# Patient Record
Sex: Male | Born: 1970 | Race: White | Hispanic: No | Marital: Married | Smoking: Never smoker
Health system: Southern US, Community
[De-identification: ages and names within clinical notes are randomized; demographics above are authoritative.]

## PROBLEM LIST (undated history)

## (undated) DIAGNOSIS — F419 Anxiety disorder, unspecified: Secondary | ICD-10-CM

## (undated) DIAGNOSIS — F329 Major depressive disorder, single episode, unspecified: Secondary | ICD-10-CM

## (undated) DIAGNOSIS — F32A Depression, unspecified: Secondary | ICD-10-CM

## (undated) DIAGNOSIS — M2420 Disorder of ligament, unspecified site: Secondary | ICD-10-CM

## (undated) DIAGNOSIS — E0590 Thyrotoxicosis, unspecified without thyrotoxic crisis or storm: Secondary | ICD-10-CM

## (undated) DIAGNOSIS — M722 Plantar fascial fibromatosis: Secondary | ICD-10-CM

## (undated) DIAGNOSIS — Z8619 Personal history of other infectious and parasitic diseases: Secondary | ICD-10-CM

## (undated) HISTORY — DX: Plantar fascial fibromatosis: M72.2

## (undated) HISTORY — DX: Depression, unspecified: F32.A

## (undated) HISTORY — DX: Major depressive disorder, single episode, unspecified: F32.9

## (undated) HISTORY — PX: NO PAST SURGERIES: SHX2092

## (undated) HISTORY — DX: Disorder of ligament, unspecified site: M24.20

## (undated) HISTORY — DX: Anxiety disorder, unspecified: F41.9

## (undated) HISTORY — DX: Thyrotoxicosis, unspecified without thyrotoxic crisis or storm: E05.90

## (undated) HISTORY — DX: Personal history of other infectious and parasitic diseases: Z86.19

---

## 2001-05-17 ENCOUNTER — Emergency Department (HOSPITAL_COMMUNITY): Admission: EM | Admit: 2001-05-17 | Discharge: 2001-05-17 | Payer: Self-pay | Admitting: Emergency Medicine

## 2003-08-07 ENCOUNTER — Emergency Department (HOSPITAL_COMMUNITY): Admission: EM | Admit: 2003-08-07 | Discharge: 2003-08-08 | Payer: Self-pay | Admitting: Emergency Medicine

## 2012-09-17 ENCOUNTER — Ambulatory Visit
Admission: RE | Admit: 2012-09-17 | Discharge: 2012-09-17 | Disposition: A | Discharge status: Home / Self Care | Payer: BC Managed Care – PPO | Source: Ambulatory Visit | Attending: Sports Medicine | Admitting: Sports Medicine

## 2012-09-17 ENCOUNTER — Ambulatory Visit (INDEPENDENT_AMBULATORY_CARE_PROVIDER_SITE_OTHER): Payer: BC Managed Care – PPO | Admitting: Family Medicine

## 2012-09-17 VITALS — BP 120/78 | Ht 72.0 in | Wt 215.0 lb

## 2012-09-17 DIAGNOSIS — R0789 Other chest pain: Secondary | ICD-10-CM

## 2012-09-17 DIAGNOSIS — M949 Disorder of cartilage, unspecified: Secondary | ICD-10-CM

## 2012-09-17 MED ORDER — TRAMADOL HCL 50 MG PO TABS: 50.0000 mg | ORAL_TABLET | Freq: Every evening | ORAL | Status: DC | PRN

## 2012-09-17 MED ORDER — MELOXICAM 15 MG PO TABS: 15.0000 mg | ORAL_TABLET | Freq: Every day | ORAL | Status: DC

## 2012-09-17 NOTE — Patient Instructions (Addendum)
Your xiphoid (the bone at the end of your sternum) is longer and closer to the skin than normal.   Some of your pain may be caused by inflammation of where the ribs attach to the sternum. Take Meloxicam 15mg  daily. Tramadol is also available to take at night for pain. We are going to get some x-rays of your ribs and sternum.  Someone will call you with the results. Depending on the x-rays, we may need to do further imaging. Follow up in 2 weeks.

## 2012-09-17 NOTE — Progress Notes (Signed)
  Subjective:    Patient ID: Bobby Stewart, male    DOB: Dec 19, 1970, 41 y.o.   MRN: [id number]  HPI Bobby Stewart is a 41 year old man coming in today for sternal pain.  He says he first noticed the pain in 2008 after a chiropractic manipulation for a pinched nerve in his neck. Since then, it has recurred intermittently.  The pain is typically located on the left side of the lower sternum and sometimes associated with a heavy feeling and shortness of breath.  Most recently, he had a severe flair 5 days ago following a cough.  Reported severe pain that took his breath for 20 minutes and radiated straight through to his back.  Denies pain being triggered by exercise.  Pain can be triggered by sleeping on his stomach.  No history of heartburn or trouble with spicy foods.  He has had a cousin in her 30s undergo some sort of heart surgery as well as an uncle who had a heart attack at age 57.  Has seen several physicians for this, but has never had x-rays or other work up.  No significant past medical history. Family history significant for heart disease. Never smoker.  Owns sport's bar in town.   Review of Systems     Objective:   Physical Exam Gen: alert, cooperative, NAD CV: RRR, no murmurs Pulm: CTAB, no wheezes or rales Chest: no erythema or edema noted over xiphoid.  On palpation the xiphoid is quite superficial and found at the L1 spinal level.  Tender along the left side of the xiphoid with a palpable squeak.      Assessment & Plan:  Xiphoid pain: Costochondritis vs xiphoid irritation.  Xiphoid is enlarged based on palpation.  May be congenital. History and exam seems most consistent with inflammation.  Will treat with meloxicam 15mg  daily.  Will also provide Tramadol 50mg  tabs for use at bedtime if needed.  Will check plain films of the sternum and left ribs.  May need MRI to evaluate the sternum/xiphoid.  Follow up in 2 weeks. Patient isn't enough pain that he would consider surgical  intervention. We will call [facility] is as well to ask if this would be something that they would potentially see the patient for if this is necessary in the long run. We'll readdress the patient again in 2 weeks.

## 2012-09-24 ENCOUNTER — Ambulatory Visit: Payer: BC Managed Care – PPO | Admitting: Family Medicine

## 2012-10-16 ENCOUNTER — Encounter: Payer: Self-pay | Admitting: Cardiothoracic Surgery

## 2012-10-16 ENCOUNTER — Encounter (INDEPENDENT_AMBULATORY_CARE_PROVIDER_SITE_OTHER): Payer: BC Managed Care – PPO | Admitting: Cardiothoracic Surgery

## 2012-11-06 ENCOUNTER — Institutional Professional Consult (permissible substitution) (INDEPENDENT_AMBULATORY_CARE_PROVIDER_SITE_OTHER): Payer: BC Managed Care – PPO | Admitting: Cardiothoracic Surgery

## 2012-11-06 ENCOUNTER — Encounter: Payer: Self-pay | Admitting: Cardiothoracic Surgery

## 2012-11-06 VITALS — BP 124/81 | HR 52 | Resp 18 | Ht 72.0 in | Wt 218.0 lb

## 2012-11-06 DIAGNOSIS — R0789 Other chest pain: Secondary | ICD-10-CM

## 2012-11-06 DIAGNOSIS — M899 Disorder of bone, unspecified: Secondary | ICD-10-CM

## 2012-11-06 DIAGNOSIS — M949 Disorder of cartilage, unspecified: Secondary | ICD-10-CM

## 2012-11-06 NOTE — Progress Notes (Signed)
PCP is Antoine Primas, DO Referring Provider is Judi Saa, DO  Chief Complaint  Patient presents with  . Chest Pain    eval for sternal/xiphoid pain    HPI: 41 year old Caucasian male nonsmoker referred for evaluation of chronically painful xiphoid. He relates the onset following a session with his chiropractic in which his arms were stretched behind his back on the exam table arching his chest forward and he felt a popping sensation. Since then when he coughs or does certain exercises oral lifts weights and a certain way he has significant sharp pain. This is been intermittent over a few years. He is been evaluated with sternal and rib x-rays which have been nonrevealing. He is been treated with a trial of Mobic as well as tramadol which have been ineffective. He denies any significant false motor vehicle accidents or direct blows to the chest. He denies any history of pneumothorax rib abnormalities or other skeletal disease.   Past Medical History  Diagnosis Date  . Anxiety and depression     No past surgical history on file.  No family history on file.  Social History History  Substance Use Topics  . Smoking status: Never Smoker   . Smokeless tobacco: Never Used  . Alcohol Use: Yes    Current Outpatient Prescriptions  Medication Sig Dispense Refill  . meloxicam (MOBIC) 15 MG tablet Take 1 tablet (15 mg total) by mouth daily.  30 tablet  0  . traMADol (ULTRAM) 50 MG tablet Take 1 tablet (50 mg total) by mouth at bedtime as needed for pain.  50 tablet  0    No Known Allergies  Review of Systems Gen. no weight loss or fever HEENT no difficulty swallowing no change in vision Thorax xiphoid pain Cardiac no history of angina arrhythmia murmur GI no history of hepatitis jaundice blood per rectum Neurologic no history of kidney stones UTI Endocrine no history of diabetes Vascular no history of thromboembolic disease no history of varicose veins Neurologic no history of  seizure syncope, right-hand dominant   BP 124/81  Pulse 52  Resp 18  Ht 6' (1.829 m)  Wt 218 lb (98.884 kg)  BMI 29.57 kg/m2  SpO2 98% Physical Exam Gen. 41 year old healthy muscular male no acute distress HEENT normocephalic pupils equal thorax clear dentition good Neck no JVD mass or carotid bruit Lymphatics not palpable nodes in the neck axilla Thorax clear breath sounds     Tenderness over the xiphoid, left side more than right, which appears to be somewhat elongated but not mobile. 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 The CT scan unremarkable with recommend excision of xiphoid to relieve source of chronic pain. Procedure discussed with patient in the agrees to proceed with this plan. Patient will return after the holidays, get a chest CT scan and then discuss a plan of therapy.

## 2012-12-05 ENCOUNTER — Other Ambulatory Visit: Payer: Self-pay | Admitting: *Deleted

## 2012-12-05 DIAGNOSIS — R0789 Other chest pain: Secondary | ICD-10-CM

## 2012-12-25 ENCOUNTER — Ambulatory Visit: Payer: BC Managed Care – PPO | Admitting: Cardiothoracic Surgery

## 2012-12-25 ENCOUNTER — Other Ambulatory Visit: Payer: BC Managed Care – PPO

## 2013-03-07 ENCOUNTER — Ambulatory Visit
Admission: RE | Admit: 2013-03-07 | Discharge: 2013-03-07 | Disposition: A | Discharge status: Home / Self Care | Payer: BC Managed Care – PPO | Source: Ambulatory Visit | Attending: Medical | Admitting: Medical

## 2013-03-07 ENCOUNTER — Encounter: Payer: Self-pay | Admitting: Medical

## 2013-03-07 ENCOUNTER — Telehealth: Payer: Self-pay | Admitting: Family Medicine

## 2013-03-07 ENCOUNTER — Ambulatory Visit (INDEPENDENT_AMBULATORY_CARE_PROVIDER_SITE_OTHER): Payer: BC Managed Care – PPO | Admitting: Medical

## 2013-03-07 VITALS — BP 112/80 | HR 58 | Temp 98.3°F | Resp 16 | Wt 222.0 lb

## 2013-03-07 DIAGNOSIS — B351 Tinea unguium: Secondary | ICD-10-CM

## 2013-03-07 DIAGNOSIS — S6992XA Unspecified injury of left wrist, hand and finger(s), initial encounter: Secondary | ICD-10-CM

## 2013-03-07 DIAGNOSIS — L259 Unspecified contact dermatitis, unspecified cause: Secondary | ICD-10-CM

## 2013-03-07 DIAGNOSIS — Z23 Encounter for immunization: Secondary | ICD-10-CM

## 2013-03-07 DIAGNOSIS — S6980XA Other specified injuries of unspecified wrist, hand and finger(s), initial encounter: Secondary | ICD-10-CM

## 2013-03-07 DIAGNOSIS — L309 Dermatitis, unspecified: Secondary | ICD-10-CM

## 2013-03-07 DIAGNOSIS — S6990XA Unspecified injury of unspecified wrist, hand and finger(s), initial encounter: Secondary | ICD-10-CM

## 2013-03-07 MED ORDER — TERBINAFINE HCL 250 MG PO TABS: 250.0000 mg | ORAL_TABLET | Freq: Every day | ORAL | Status: DC

## 2013-03-07 NOTE — Addendum Note (Signed)
Addended by: Janeice Robinson on: 03/07/2013 09:42 AM   Modules accepted: Orders

## 2013-03-07 NOTE — Progress Notes (Signed)
Subjective:  Bobby Stewart is a 42 y.o. male who presents as a new patient with hand injury.  Had been to [facility] prior.  He works in a [employer].  He got the left hand caught under a Keg on 02/24/13.   When he went to set the Keg down, it fell on his hand.    Swelling was worse, but he does reports swelling in lateral hand along 4-5th fingers, but has pain mostly pain in the left 5th finger/hand area.  ROM of 4th and 5th finger is decreased.   Has used ice some, Aleve.    Of note, he has longstanding nail deformity of the left hand that he said started when he sat on the nail hard in the past.  Since then the nails have grown funny.  The left fingernails and toenails have yellow coloration.   He has ongoing chronic rough skin of the left hand that he attributes to the nail fungus.   Has used OTC cream in the past with no improvement.  He refutes prior diagnosis by [facility] that the nail issue and hand rash/rough skin was not related to the injury/disease condition he describes.      No other c/o.  The following portions of the patient's history were reviewed and updated as appropriate: allergies, current medications, past family history, past medical history, past social history, past surgical history and problem list.  ROS Otherwise as in subjective above  Objective: Physical Exam  Vital signs reviewed  General appearance: alert, no distress, WD/WN Skin: left palm with generalized mild erythema, flaking, dry and somewhat raw appearing.  Left fingernails with some yellowish coloration mild, left index fingernail almost has a spooning appearance, left fingernails in general thickened, left middle fingernail with growing deformity from prior trauma to nail bed presumably.  Left toenails yellow and thickened throughout. MSK: left hand with tenderness and moderate swelling at distal 4th and 5th metacarpals.  4-5th finger ROM at MCP moderately decreased due to pain at the  metacarpal.  DIPs normal ROM.  otherwise fingers, thumb, wrist with normal ROM, nontender.  Injury suggests boxers fracture although he reports a crush injury.  UE pulses and cap refill normal Neuro: light touch seems decreased in left dorsal distal hand along 4-5th metacarpals in area of injury, but sharp touch normal.  otherwise sensation normal.  Strength of left 4-5th fingers decreased due to pain and swelling in the area of injury.  Rest of hands and fingers with normal strength.   Assessment: Encounter Diagnoses  Name Primary?  . Hand injury, left, initial encounter Yes  . Need for Tdap vaccination   . Onychomycosis   . Dermatitis   . Fingernail injury, left, initial encounter     Plan: Hand injury - will send for xray of left hand.  Likely nondisplaced fracture of distal 4th and 5th metacarpals.  Tdap vaccine, VIS and counseling given  Onychomycosis - begin Lamisil oral x 4-6 wk.  Discussed diagnosis, usual course of treatment and the delayed response to expect as the healthy nail will take time to grow out.  Dermatitis - hand/palm appears suggestive of contact dermatitis.  Fingernail injury - possibly prior trauma as he states, but could be underlying process such as fugal infection, iron deficiency.  We will proceed with antifungal therapy for now.  Follow up pending xray.

## 2013-03-07 NOTE — Telephone Encounter (Signed)
Patient is aware of his appointment at [facility]. On 03/07/13 @ 330 pm to see Dr. Ophelia Charter. CLS [phone]

## 2013-06-12 ENCOUNTER — Other Ambulatory Visit: Payer: Self-pay | Admitting: Medical

## 2013-06-12 NOTE — Telephone Encounter (Signed)
Is this okay to refill? 

## 2013-07-11 ENCOUNTER — Telehealth: Payer: Self-pay | Admitting: *Deleted

## 2013-07-11 ENCOUNTER — Other Ambulatory Visit: Payer: Self-pay | Admitting: Medical

## 2013-07-11 NOTE — Telephone Encounter (Signed)
Got a refill request for a second round of Lamisil, first round was 03/07/13 #42 for 4-6 weeks, called patient and he did state that his nails cleared up after the first round but he feels like a second round would really take care of it. Wasn't sure if he needed to come in to see you, or come in for liver panel? Please advise. Thanks.

## 2013-07-11 NOTE — Telephone Encounter (Signed)
Called patient to let him know that he is required to come in for OV if he is desiring that Lamisil be refilled. Left message.

## 2015-08-25 ENCOUNTER — Other Ambulatory Visit: Payer: Self-pay | Admitting: Chiropractic Medicine

## 2015-08-25 DIAGNOSIS — M542 Cervicalgia: Secondary | ICD-10-CM

## 2015-08-25 DIAGNOSIS — R519 Headache, unspecified: Secondary | ICD-10-CM

## 2015-08-25 DIAGNOSIS — R51 Headache: Secondary | ICD-10-CM

## 2015-09-03 ENCOUNTER — Ambulatory Visit
Admission: RE | Admit: 2015-09-03 | Discharge: 2015-09-03 | Disposition: A | Discharge status: Home / Self Care | Payer: BLUE CROSS/BLUE SHIELD | Source: Ambulatory Visit | Attending: Chiropractic Medicine | Admitting: Chiropractic Medicine

## 2015-09-03 DIAGNOSIS — M542 Cervicalgia: Secondary | ICD-10-CM

## 2015-09-03 DIAGNOSIS — R519 Headache, unspecified: Secondary | ICD-10-CM

## 2015-09-03 DIAGNOSIS — R51 Headache: Secondary | ICD-10-CM

## 2016-07-04 NOTE — Progress Notes (Signed)
This encounter was created in error - please disregard.

## 2017-05-14 DIAGNOSIS — M542 Cervicalgia: Secondary | ICD-10-CM | POA: Diagnosis not present

## 2017-05-14 DIAGNOSIS — M25571 Pain in right ankle and joints of right foot: Secondary | ICD-10-CM | POA: Diagnosis not present

## 2017-05-15 DIAGNOSIS — M542 Cervicalgia: Secondary | ICD-10-CM | POA: Diagnosis not present

## 2017-05-15 DIAGNOSIS — M722 Plantar fascial fibromatosis: Secondary | ICD-10-CM | POA: Diagnosis not present

## 2017-05-22 DIAGNOSIS — M722 Plantar fascial fibromatosis: Secondary | ICD-10-CM | POA: Diagnosis not present

## 2017-05-22 DIAGNOSIS — M542 Cervicalgia: Secondary | ICD-10-CM | POA: Diagnosis not present

## 2017-05-24 DIAGNOSIS — M542 Cervicalgia: Secondary | ICD-10-CM | POA: Diagnosis not present

## 2017-05-24 DIAGNOSIS — M722 Plantar fascial fibromatosis: Secondary | ICD-10-CM | POA: Diagnosis not present

## 2017-05-28 DIAGNOSIS — K644 Residual hemorrhoidal skin tags: Secondary | ICD-10-CM | POA: Diagnosis not present

## 2017-05-28 DIAGNOSIS — M722 Plantar fascial fibromatosis: Secondary | ICD-10-CM | POA: Diagnosis not present

## 2017-05-28 DIAGNOSIS — M542 Cervicalgia: Secondary | ICD-10-CM | POA: Diagnosis not present

## 2017-05-31 DIAGNOSIS — K645 Perianal venous thrombosis: Secondary | ICD-10-CM | POA: Diagnosis not present

## 2017-06-05 DIAGNOSIS — M722 Plantar fascial fibromatosis: Secondary | ICD-10-CM | POA: Diagnosis not present

## 2017-06-05 DIAGNOSIS — M542 Cervicalgia: Secondary | ICD-10-CM | POA: Diagnosis not present

## 2017-06-11 DIAGNOSIS — M542 Cervicalgia: Secondary | ICD-10-CM | POA: Diagnosis not present

## 2017-06-11 DIAGNOSIS — M722 Plantar fascial fibromatosis: Secondary | ICD-10-CM | POA: Diagnosis not present

## 2017-08-24 ENCOUNTER — Ambulatory Visit (INDEPENDENT_AMBULATORY_CARE_PROVIDER_SITE_OTHER): Payer: 59

## 2017-08-24 ENCOUNTER — Encounter: Payer: Self-pay | Admitting: Podiatry

## 2017-08-24 ENCOUNTER — Ambulatory Visit (INDEPENDENT_AMBULATORY_CARE_PROVIDER_SITE_OTHER): Payer: 59 | Admitting: Podiatry

## 2017-08-24 VITALS — BP 112/53 | HR 53

## 2017-08-24 DIAGNOSIS — M79671 Pain in right foot: Secondary | ICD-10-CM

## 2017-08-24 DIAGNOSIS — M779 Enthesopathy, unspecified: Secondary | ICD-10-CM | POA: Diagnosis not present

## 2017-08-24 DIAGNOSIS — M79672 Pain in left foot: Secondary | ICD-10-CM

## 2017-08-24 MED ORDER — TRIAMCINOLONE ACETONIDE 10 MG/ML IJ SUSP
10.0000 mg | Freq: Once | INTRAMUSCULAR | Status: AC
  Administered 2017-08-24: 10 mg

## 2017-08-24 MED ORDER — DICLOFENAC SODIUM 75 MG PO TBEC: 75.0000 mg | DELAYED_RELEASE_TABLET | Freq: Two times a day (BID) | ORAL | 2 refills | Status: DC

## 2017-08-24 MED ORDER — TERBINAFINE HCL 250 MG PO TABS: 250.0000 mg | ORAL_TABLET | Freq: Every day | ORAL | 0 refills | Status: DC

## 2017-08-24 NOTE — Progress Notes (Signed)
   Subjective:    Patient ID: Bobby Stewart, male    DOB: 09/19/1971, 46 y.o.   MRN: [id number]  HPI  Chief Complaint  Patient presents with  . Foot Pain    B/L       Review of Systems  Musculoskeletal: Positive for gait problem.       Wrist pain  Neurological: Positive for numbness and headaches.  All other systems reviewed and are negative.      Objective:   Physical Exam        Assessment & Plan:

## 2017-08-25 NOTE — Progress Notes (Signed)
Subjective:    Patient ID: Bobby Stewart, male   DOB: 46 y.o.   MRN: [id number]   HPI patient presents with quite a bit of discomfort around the medial ankle region bilateral with history of this problem that has occurred periodically over the last few years with history of boot usage. The right is worse    Review of Systems  All other systems reviewed and are negative.       Objective:  Physical Exam  Constitutional: He appears well-developed and well-nourished.  Cardiovascular: Intact distal pulses.   Pulmonary/Chest: Effort normal.  Musculoskeletal: Normal range of motion.  Neurological: He is alert. Abnormal coordination: neurovascular status intact muscle strength adequate range of motion was found to be within normal limits with moderate depression of the arch. Patient has significant discomfort in the posterior tibial tendon right at the insertion and slightly proximal t.  Skin: Skin is warm.  Nursing note and vitals reviewed.  2 the tendon with no indication of posterior tibial dysfunction with mild edema within the area. Also has mild nail disease and skin disease between the toes     Assessment:    Posterior tibial tendinitis right. Noted to have probable mycotic infection of skin with mild nail involvement   Plan:    Careful sheath injection administered 3 mg Kenalog 5 mg Xylocaine and applied fascial brace to lift the arch. Reviewed x-rays and discussed orthotics and other treatments depending on response  X-rays indicate that there is moderate depression of the arch with no indications of advanced pathology. Also discussed nail disease and I did go ahead today I placed him on Lamisil 250 for 30 days and placed him on oral anti-inflammatory diclofenac

## 2017-08-27 ENCOUNTER — Telehealth: Payer: Self-pay | Admitting: Medical

## 2017-08-27 NOTE — Telephone Encounter (Signed)
Pt states he is longer a pt here,

## 2017-08-27 NOTE — Telephone Encounter (Signed)
Please call patient to set up yearly physical.   This is a friendly reminder to have them come in for annual wellness exam/physical. Thanks Shane 

## 2017-09-07 ENCOUNTER — Ambulatory Visit (INDEPENDENT_AMBULATORY_CARE_PROVIDER_SITE_OTHER): Payer: 59 | Admitting: Podiatry

## 2017-09-07 ENCOUNTER — Encounter: Payer: Self-pay | Admitting: Podiatry

## 2017-09-07 DIAGNOSIS — M779 Enthesopathy, unspecified: Secondary | ICD-10-CM

## 2017-09-07 NOTE — Progress Notes (Signed)
Subjective:    Patient ID: Bobby Stewart, male   DOB: 46 y.o.   MRN: [id number]   HPI patient states she's improved but still having soreness    ROS      Objective:  Physical Exam neurovascular status intact with continued tendinitis posterior tibial tendon right that's moderately improved but still mildly discomforting along with the left     Assessment:    Chronic tendinitis bilateral     Plan:    H&P condition reviewed and I've recommended long-term orthotics and I scheduled him with [facility] for orthotic devices. He is on his feet quite a bit at work and will benefit from a device that provide support and cushion and also he is quite active with exercise

## 2017-09-13 ENCOUNTER — Other Ambulatory Visit: Payer: 59 | Admitting: Orthotics

## 2017-09-13 DIAGNOSIS — M722 Plantar fascial fibromatosis: Secondary | ICD-10-CM

## 2017-10-04 ENCOUNTER — Ambulatory Visit: Payer: 59 | Admitting: Orthotics

## 2017-10-04 DIAGNOSIS — M79672 Pain in left foot: Secondary | ICD-10-CM

## 2017-10-04 DIAGNOSIS — M779 Enthesopathy, unspecified: Secondary | ICD-10-CM

## 2017-10-04 DIAGNOSIS — M79671 Pain in right foot: Secondary | ICD-10-CM

## 2017-10-04 NOTE — Progress Notes (Signed)
Patient came in today to pick up custom made foot orthotics.  The goals were accomplished and the patient reported no dissatisfaction with said orthotics.  Patient was advised of breakin period and how to report any issues. 

## 2017-10-05 ENCOUNTER — Telehealth: Payer: Self-pay | Admitting: *Deleted

## 2017-10-05 NOTE — Telephone Encounter (Signed)
Refill request terbinafine. Dr.Regal prescribed 08/24/2017 #30.

## 2017-10-19 ENCOUNTER — Telehealth: Payer: Self-pay | Admitting: *Deleted

## 2017-10-19 NOTE — Telephone Encounter (Signed)
Refill request for terbinafine.

## 2018-01-14 DIAGNOSIS — J039 Acute tonsillitis, unspecified: Secondary | ICD-10-CM | POA: Diagnosis not present

## 2018-01-30 DIAGNOSIS — R599 Enlarged lymph nodes, unspecified: Secondary | ICD-10-CM | POA: Diagnosis not present

## 2018-01-30 DIAGNOSIS — R05 Cough: Secondary | ICD-10-CM | POA: Diagnosis not present

## 2018-01-30 DIAGNOSIS — J029 Acute pharyngitis, unspecified: Secondary | ICD-10-CM | POA: Diagnosis not present

## 2018-02-12 ENCOUNTER — Ambulatory Visit: Payer: 59 | Admitting: Physician Assistant

## 2018-02-12 ENCOUNTER — Encounter: Payer: Self-pay | Admitting: Physician Assistant

## 2018-02-12 ENCOUNTER — Other Ambulatory Visit: Payer: Self-pay

## 2018-02-12 VITALS — BP 108/70 | HR 49 | Temp 97.9°F | Resp 16 | Ht 72.0 in | Wt 218.0 lb

## 2018-02-12 DIAGNOSIS — R42 Dizziness and giddiness: Secondary | ICD-10-CM

## 2018-02-12 DIAGNOSIS — R5382 Chronic fatigue, unspecified: Secondary | ICD-10-CM | POA: Diagnosis not present

## 2018-02-12 LAB — CBC WITH DIFFERENTIAL/PLATELET
BASOS PCT: 0.7 % (ref 0.0–3.0)
Basophils Absolute: 0 10*3/uL (ref 0.0–0.1)
EOS PCT: 3.8 % (ref 0.0–5.0)
Eosinophils Absolute: 0.2 10*3/uL (ref 0.0–0.7)
HCT: 43.5 % (ref 39.0–52.0)
Hemoglobin: 14.8 g/dL (ref 13.0–17.0)
LYMPHS ABS: 1.8 10*3/uL (ref 0.7–4.0)
Lymphocytes Relative: 34.3 % (ref 12.0–46.0)
MCHC: 34.1 g/dL (ref 30.0–36.0)
MCV: 84.9 fl (ref 78.0–100.0)
MONO ABS: 0.4 10*3/uL (ref 0.1–1.0)
Monocytes Relative: 7.2 % (ref 3.0–12.0)
NEUTROS PCT: 54 % (ref 43.0–77.0)
Neutro Abs: 2.9 10*3/uL (ref 1.4–7.7)
Platelets: 177 10*3/uL (ref 150.0–400.0)
RBC: 5.12 Mil/uL (ref 4.22–5.81)
RDW: 12.4 % (ref 11.5–15.5)
WBC: 5.3 10*3/uL (ref 4.0–10.5)

## 2018-02-12 LAB — VITAMIN D 25 HYDROXY (VIT D DEFICIENCY, FRACTURES): VITD: 32.83 ng/mL (ref 30.00–100.00)

## 2018-02-12 LAB — COMPREHENSIVE METABOLIC PANEL
ALBUMIN: 4.6 g/dL (ref 3.5–5.2)
ALT: 17 U/L (ref 0–53)
AST: 17 U/L (ref 0–37)
Alkaline Phosphatase: 42 U/L (ref 39–117)
BUN: 24 mg/dL — AB (ref 6–23)
CHLORIDE: 101 meq/L (ref 96–112)
CO2: 32 mEq/L (ref 19–32)
Calcium: 9.8 mg/dL (ref 8.4–10.5)
Creatinine, Ser: 1.2 mg/dL (ref 0.40–1.50)
GFR: 69.57 mL/min (ref >60.00)
GLUCOSE: 80 mg/dL (ref 70–99)
POTASSIUM: 4.3 meq/L (ref 3.5–5.1)
SODIUM: 137 meq/L (ref 135–145)
Total Bilirubin: 0.7 mg/dL (ref 0.2–1.2)
Total Protein: 7 g/dL (ref 6.0–8.3)

## 2018-02-12 LAB — TSH: TSH: 8.44 u[IU]/mL — ABNORMAL HIGH (ref 0.35–4.50)

## 2018-02-12 LAB — SEDIMENTATION RATE: Sed Rate: 3 mm/hr (ref 0–15)

## 2018-02-12 LAB — VITAMIN B12: VITAMIN B 12: 889 pg/mL (ref 211–911)

## 2018-02-12 NOTE — Progress Notes (Signed)
 Patient presents to clinic today to establish care.  Acute Concerns: Patient endorses issue with fever, sore throat and fatigue starting a few weeks previously. Was seen at an UC and diagnosed with tonsillitis. Strep testing was negative but he was given course of Amoxicillin with improvement in symptoms. Symptoms worsened after treatment ended and so he was seen again and given a penicillin injection. Notes resolution of fever and sore throat. Fatigue has remained since that time as is getting more severe. Notes slight decrease is mood. Denies constipation. Does note dryness of skin and mental fogginess. Patient denies any unexplainable changes in weight.   Health Maintenance: Immunizations -- Tetanus up-to-date. Declines flu shot.   Past Medical History:  Diagnosis Date  . Anxiety and depression   . History of chickenpox     Past Surgical History:  Procedure Laterality Date  . NO PAST SURGERIES      No current outpatient medications on file prior to visit.   No current facility-administered medications on file prior to visit.     No Known Allergies  Family History  Problem Relation Age of Onset  . Healthy Mother   . Healthy Father   . Lupus Sister     Social History   Socioeconomic History  . Marital status: Married    Spouse name: Not on file  . Number of children: Not on file  . Years of education: Not on file  . Highest education level: Not on file  Social Needs  . Financial resource strain: Not on file  . Food insecurity - worry: Not on file  . Food insecurity - inability: Not on file  . Transportation needs - medical: Not on file  . Transportation needs - non-medical: Not on file  Occupational History  . Not on file  Tobacco Use  . Smoking status: Never Smoker  . Smokeless tobacco: Never Used  Substance and Sexual Activity  . Alcohol use: Yes    Comment: occasional  . Drug use: No  . Sexual activity: Yes  Other Topics Concern  . Not on file    Social History Narrative  . Not on file   Review of Systems  Constitutional: Positive for malaise/fatigue. Negative for chills, diaphoresis, fever and weight loss.  HENT: Negative for hearing loss.   Eyes: Negative for blurred vision and double vision.  Respiratory: Negative for cough and shortness of breath.   Cardiovascular: Negative for chest pain, palpitations and orthopnea.  Gastrointestinal: Negative for heartburn and nausea.  Skin: Negative for rash.  Neurological: Positive for dizziness. Negative for tingling, tremors, sensory change, speech change, focal weakness, seizures and headaches.  Psychiatric/Behavioral: Negative for depression, hallucinations, substance abuse and suicidal ideas. The patient is not nervous/anxious and does not have insomnia.    BP 108/70   Pulse (!) 49   Temp 97.9 F (36.6 C) (Oral)   Resp 16   Ht 6' (1.829 m)   Wt 218 lb (98.9 kg)   SpO2 98%   BMI 29.57 kg/m   Physical Exam  Constitutional: He is oriented to person, place, and time and well-developed, well-nourished, and in no distress.  HENT:  Head: Normocephalic and atraumatic.  Right Ear: External ear normal.  Left Ear: External ear normal.  Nose: Nose normal.  Mouth/Throat: Oropharynx is clear and moist. No oropharyngeal exudate.  TM within normal limits bilaterally.  Eyes: Conjunctivae and EOM are normal. Pupils are equal, round, and reactive to light.  Neck: Neck supple. No thyromegaly present.    Cardiovascular: Normal rate, regular rhythm, normal heart sounds and intact distal pulses.  Pulmonary/Chest: Effort normal and breath sounds normal. No respiratory distress. He has no wheezes. He has no rales. He exhibits no tenderness.  Abdominal: Soft. Bowel sounds are normal. He exhibits no distension and no mass. There is no tenderness. There is no rebound and no guarding.  Musculoskeletal: Normal range of motion.  Lymphadenopathy:    He has no cervical adenopathy.  Neurological: He is  alert and oriented to person, place, and time.  Skin: Skin is warm and dry. No rash noted.  Psychiatric: Affect normal.  Vitals reviewed.  Assessment/Plan: 1. Chronic fatigue Unclear etiology. Question Mono-like or other viral illness giving recent history of pharyngitis with negative strep testing along with fatigue and history of fever. Will check labs today to further assess and exam unremarkable.  - EKG 12-Lead - CBC w/Diff - Comp Met (CMET) - TSH - Epstein-Barr virus VCA antibody panel - CMV abs, IgG+IgM (cytomegalovirus) - B. burgdorfi antibodies - B12 - Vitamin D (25 hydroxy) - Sedimentation rate  2. Postural dizziness EKG obtained revealing bradycardia. This is chronic for patient. Will check labs today to include TSH. If unremarkable, recommend Cardiology assessment.  - EKG 12-Lead   William Cody Martin, PA-C  

## 2018-02-12 NOTE — Patient Instructions (Signed)
Please go to the lab today for blood work.  I will call you with your results. We will alter treatment regimen(s) if indicated by your results.   Your EKG looks good today except for the slow hear-rate. Reviewing your chart though, it seems that your heart rate usually runs in the upper 40s to upper 50s. This may be your normal, but if all labs are unremarkable, I will want you to see a Cardiologist for assessment.   Keep a well-balanced diet and stay hydrated.  Refrain from gym for now until we figure this out.

## 2018-02-13 ENCOUNTER — Other Ambulatory Visit (INDEPENDENT_AMBULATORY_CARE_PROVIDER_SITE_OTHER): Payer: 59

## 2018-02-13 DIAGNOSIS — R7989 Other specified abnormal findings of blood chemistry: Secondary | ICD-10-CM | POA: Diagnosis not present

## 2018-02-13 LAB — T4, FREE: Free T4: 1.05 ng/dL (ref 0.60–1.60)

## 2018-02-13 LAB — T3, FREE: T3, Free: 2.8 pg/mL (ref 2.3–4.2)

## 2018-02-14 ENCOUNTER — Telehealth: Payer: Self-pay | Admitting: Physician Assistant

## 2018-02-14 NOTE — Telephone Encounter (Signed)
Copied from CRM [phone]. Topic: Quick Communication - See Telephone Encounter >> Feb 14, 2018  3:41 PM Trula SladeWalter, Linda F wrote: CRM for notification. See Telephone encounter for:  02/14/18. Patient would like a call back with his lab results.

## 2018-02-14 NOTE — Telephone Encounter (Signed)
LM for patient letting him know that we are waiting on a couple tests from labs and that we will call him as soon as we have those results.

## 2018-02-15 ENCOUNTER — Encounter: Payer: Self-pay | Admitting: Physician Assistant

## 2018-02-15 ENCOUNTER — Telehealth: Payer: Self-pay | Admitting: Physician Assistant

## 2018-02-15 LAB — CMV ABS, IGG+IGM (CYTOMEGALOVIRUS)
CMV IgM: <30 AU/mL
Cytomegalovirus Ab-IgG: 6.4 U/mL — ABNORMAL HIGH

## 2018-02-15 LAB — EPSTEIN-BARR VIRUS VCA ANTIBODY PANEL
EBV NA IgG: 329 U/mL — ABNORMAL HIGH
EBV VCA IgG: >750 U/mL — ABNORMAL HIGH
EBV VCA IgM: <36 U/mL

## 2018-02-15 LAB — B. BURGDORFI ANTIBODIES

## 2018-02-15 NOTE — Telephone Encounter (Signed)
Pt. called to receive lab results.  Reported he saw the results on MyChart.  Informed of result notes per Malva Coganody Martin, PA from  02/15/18.  Verb. Understanding.  Pt. had questions about the explanation on the test for Lyme Disease, that stated "the antibodies may be falsley negative in the early stages of Lyme disease."    Re: present symptoms: denied fever; C/o intermittent sore throat, if he is in a cold, dry environment;  swelling in the throat is improved; c/o intermittent dizziness.  Reported he is under increased stress at work at this time.   Follow up appt. was scheduled on 02/18/18 at 9:00 AM, per recommendation of Malva Coganody Martin, [place].

## 2018-02-15 NOTE — Telephone Encounter (Signed)
Will discuss at his visit Monday.

## 2018-02-18 ENCOUNTER — Other Ambulatory Visit: Payer: Self-pay

## 2018-02-18 ENCOUNTER — Encounter: Payer: Self-pay | Admitting: Physician Assistant

## 2018-02-18 ENCOUNTER — Ambulatory Visit: Payer: 59 | Admitting: Physician Assistant

## 2018-02-18 VITALS — BP 110/78 | HR 51 | Temp 98.0°F | Resp 16 | Ht 72.0 in | Wt 218.0 lb

## 2018-02-18 DIAGNOSIS — R7989 Other specified abnormal findings of blood chemistry: Secondary | ICD-10-CM

## 2018-02-18 MED ORDER — TIZANIDINE HCL 4 MG PO CAPS: 4.0000 mg | ORAL_CAPSULE | Freq: Three times a day (TID) | ORAL | 0 refills | Status: DC | PRN

## 2018-02-18 NOTE — Progress Notes (Signed)
Patient presents to clinic today for follow-up to discuss lab results. At last visit, patient noted to have significant fatigue after recovering from pharyngitis. Lab assessment performed due to concern of other etiology. TSH was moderately elevated at 8.4. Free T4 and T3 within normal limits. Patient needing further assessment of thyroid as there is concern for a developing autoimmune thyroid process.   Past Medical History:  Diagnosis Date  . Anxiety and depression   . History of chickenpox     Current Outpatient Medications on File Prior to Visit  Medication Sig Dispense Refill  . cyanocobalamin 500 MCG tablet Take 500 mcg by mouth daily.    . multivitamin (ONE-A-DAY MEN'S) TABS tablet Take 1 tablet by mouth daily.    . OIL OF [place]O PO Take by mouth.     No current facility-administered medications on file prior to visit.     No Known Allergies  Family History  Problem Relation Age of Onset  . Healthy Mother   . Healthy Father   . Lupus Sister   . Thyroid disease Sister   . Lung cancer Maternal Grandfather        Smoker  . AAA (abdominal aortic aneurysm) Paternal Grandfather     Social History   Socioeconomic History  . Marital status: Married    Spouse name: None  . Number of children: None  . Years of education: None  . Highest education level: None  Social Needs  . Financial resource strain: None  . Food insecurity - worry: None  . Food insecurity - inability: None  . Transportation needs - medical: None  . Transportation needs - non-medical: None  Occupational History  . None  Tobacco Use  . Smoking status: Never Smoker  . Smokeless tobacco: Never Used  Substance and Sexual Activity  . Alcohol use: Yes    Comment: occasional  . Drug use: No  . Sexual activity: Yes  Other Topics Concern  . None  Social History Narrative  . None    Review of Systems - See HPI.  All other ROS are negative.  BP 110/78   Pulse (!) 51   Temp 98 F (36.7 C)  (Oral)   Resp 16   Ht 6' (1.829 m)   Wt 218 lb (98.9 kg)   SpO2 98%   BMI 29.57 kg/m   Physical Exam  Constitutional: He is oriented to person, place, and time and well-developed, well-nourished, and in no distress.  HENT:  Head: Normocephalic and atraumatic.  Eyes: Conjunctivae are normal.  Neck: Neck supple. No thyromegaly present.  Cardiovascular: Normal rate, regular rhythm, normal heart sounds and intact distal pulses.  Pulmonary/Chest: Effort normal and breath sounds normal. No respiratory distress. He has no wheezes. He has no rales. He exhibits no tenderness.  Neurological: He is alert and oriented to person, place, and time.  Skin: Skin is warm and dry. No rash noted.  Psychiatric: Affect normal.  Vitals reviewed.   Recent Results (from the past 2160 hour(s))  CBC w/Diff     Status: None   Collection Time: 02/12/18 10:33 AM  Result Value Ref Range   WBC 5.3 4.0 - 10.5 K/uL   RBC 5.12 4.22 - 5.81 Mil/uL   Hemoglobin 14.8 13.0 - 17.0 g/dL   HCT 43.5 39.0 - 52.0 %   MCV 84.9 78.0 - 100.0 fl   MCHC 34.1 30.0 - 36.0 g/dL   RDW 12.4 11.5 - 15.5 %   Platelets 177.0 150.0 -  400.0 K/uL   Neutrophils Relative % 54.0 43.0 - 77.0 %   Lymphocytes Relative 34.3 12.0 - 46.0 %   Monocytes Relative 7.2 3.0 - 12.0 %   Eosinophils Relative 3.8 0.0 - 5.0 %   Basophils Relative 0.7 0.0 - 3.0 %   Neutro Abs 2.9 1.4 - 7.7 K/uL   Lymphs Abs 1.8 0.7 - 4.0 K/uL   Monocytes Absolute 0.4 0.1 - 1.0 K/uL   Eosinophils Absolute 0.2 0.0 - 0.7 K/uL   Basophils Absolute 0.0 0.0 - 0.1 K/uL  Comp Met (CMET)     Status: Abnormal   Collection Time: 02/12/18 10:33 AM  Result Value Ref Range   Sodium 137 135 - 145 mEq/L   Potassium 4.3 3.5 - 5.1 mEq/L   Chloride 101 96 - 112 mEq/L   CO2 32 19 - 32 mEq/L   Glucose, Bld 80 70 - 99 mg/dL   BUN 24 (H) 6 - 23 mg/dL   Creatinine, Ser 1.20 0.40 - 1.50 mg/dL   Total Bilirubin 0.7 0.2 - 1.2 mg/dL   Alkaline Phosphatase 42 39 - 117 U/L   AST 17 0 - 37  U/L   ALT 17 0 - 53 U/L   Total Protein 7.0 6.0 - 8.3 g/dL   Albumin 4.6 3.5 - 5.2 g/dL   Calcium 9.8 8.4 - 10.5 mg/dL   GFR 69.57 >60.00 mL/min  TSH     Status: Abnormal   Collection Time: 02/12/18 10:33 AM  Result Value Ref Range   TSH 8.44 (H) 0.35 - 4.50 uIU/mL  Epstein-Barr virus VCA antibody panel     Status: Abnormal   Collection Time: 02/12/18 10:33 AM  Result Value Ref Range   EBV VCA IgM <36.00 U/mL    Comment:       U/mL              Interpretation       ----              --------------       <36.00            Negative       36.00-43.99       Equivocal       >43.99            Positive    EBV VCA IgG >750.00 (H) U/mL    Comment:        U/mL             Interpretation        ----             --------------        <18.00           Negative        18.00-21.99      Equivocal        >21.99           Positive    EBV NA IgG 329.00 (H) U/mL    Comment:        U/mL             Interpretation        ----             --------------        <18.00           Negative        18.00-21.99      Equivocal        >  21.99           Positive    Interpretation      Comment: . Suggestive of a past Epstein-Barr virus infection. In infants, a similar pattern may occur as a result of passive maternal transfer of antibody. .   CMV abs, IgG+IgM (cytomegalovirus)     Status: Abnormal   Collection Time: 02/12/18 10:33 AM  Result Value Ref Range   Cytomegalovirus Ab-IgG 6.40 (H) U/mL    Comment:                            U/mL         Interpretation                            -----         --------------                            <0.60         Negative                            0.60-0.69     Equivocal                            > or = 0.70   Positive . A positive result indicates that the patient has  antibody to CMV. It does not differentiate between an active or past infection.    CMV IgM <30.00 AU/mL    Comment:     AU/mL                 Interpretation     -----                  --------------     <30.00                No Antibody Detected     30.00-34.99           Equivocal     > or = 35.00          Antibody Detected . Results from any one IgM assay should not be used as a sole determinant of a current or recent infection.  Because an IgM test can yield false positive results and  low level IgM antibody may persist for more than 12  months post infection, reliance on a single test result  could be misleading. Acute infection is best diagnosed  by demonstrating the conversion of IgG from negative to  positive. If an acute infection is suspected, consider  obtaining a new specimen and submit for both IgG and IgM  testing in two or more weeks. Evonnie Pat antibodies     Status: None   Collection Time: 02/12/18 10:33 AM  Result Value Ref Range   B burgdorferi Ab IgG+IgM <0.90 index    Comment:                    Index                Interpretation                    -----                --------------                    <  0.90               Negative                    0.90-1.09            Equivocal                    > 1.09               Positive . As recommended by the Food and Drug Administration  (FDA), all samples with positive or equivocal  results in a Borrelia burgdorferi antibody screen will be tested using a blot method. Positive or  equivocal screening test results should not be  interpreted as truly positive until verified as such  using a supplemental assay (e.g., B. burgdorferi blot). . The screening test and/or blot for B. burgdorferi  antibodies may be falsely negative in early stages of Lyme disease, including the period when erythema  migrans is apparent. .   B12     Status: None   Collection Time: 02/12/18 10:33 AM  Result Value Ref Range   Vitamin B-12 889 211 - 911 pg/mL  Vitamin D (25 hydroxy)     Status: None   Collection Time: 02/12/18 10:33 AM  Result Value Ref Range   VITD 32.83 30.00 - 100.00 ng/mL  Sedimentation  rate     Status: None   Collection Time: 02/12/18 10:33 AM  Result Value Ref Range   Sed Rate 3 0 - 15 mm/hr  T3, free     Status: None   Collection Time: 02/13/18  2:10 PM  Result Value Ref Range   T3, Free 2.8 2.3 - 4.2 pg/mL  T4, free     Status: None   Collection Time: 02/13/18  2:10 PM  Result Value Ref Range   Free T4 1.05 0.60 - 1.60 ng/dL    Comment: Specimens from patients who are undergoing biotin therapy and /or ingesting biotin supplements may contain high levels of biotin.  The higher biotin concentration in these specimens interferes with this Free T4 assay.  Specimens that contain high levels  of biotin may cause false high results for this Free T4 assay.  Please interpret results in light of the total clinical presentation of the patient.     Assessment/Plan: 1. Abnormal TSH Repeat TSH today. Will check TPO Ab and ANA. If Thyroid antibodies + will start treatment. If negative, will repeat TSH and Tf in 4 weeks.  - TSH - Thyroid peroxidase antibody - Antinuclear Antib (ANA)   Leeanne Rio, PA-C

## 2018-02-18 NOTE — Patient Instructions (Signed)
Please go to the lab today for blood work.  I will call you with your results. We will alter treatment regimen(s) if indicated by your results.    

## 2018-02-19 ENCOUNTER — Other Ambulatory Visit: Payer: Self-pay | Admitting: Physician Assistant

## 2018-02-19 DIAGNOSIS — E063 Autoimmune thyroiditis: Secondary | ICD-10-CM

## 2018-02-19 LAB — TSH: TSH: 8.46 u[IU]/mL — AB (ref 0.35–4.50)

## 2018-02-19 MED ORDER — LEVOTHYROXINE SODIUM 50 MCG PO TABS: 50.0000 ug | ORAL_TABLET | Freq: Every day | ORAL | 1 refills | Status: DC

## 2018-02-20 LAB — ANA: ANA: NEGATIVE

## 2018-02-20 LAB — THYROID PEROXIDASE ANTIBODY: THYROID PEROXIDASE ANTIBODY: 46 [IU]/mL — AB (ref <9)

## 2018-03-03 ENCOUNTER — Encounter: Payer: Self-pay | Admitting: Physician Assistant

## 2018-03-07 ENCOUNTER — Ambulatory Visit: Payer: 59 | Admitting: Physician Assistant

## 2018-03-07 ENCOUNTER — Encounter: Payer: Self-pay | Admitting: Physician Assistant

## 2018-03-07 ENCOUNTER — Other Ambulatory Visit: Payer: Self-pay

## 2018-03-07 VITALS — BP 120/82 | HR 51 | Temp 97.8°F | Resp 16 | Ht 72.0 in | Wt 216.0 lb

## 2018-03-07 DIAGNOSIS — R55 Syncope and collapse: Secondary | ICD-10-CM | POA: Diagnosis not present

## 2018-03-07 DIAGNOSIS — E063 Autoimmune thyroiditis: Secondary | ICD-10-CM

## 2018-03-07 DIAGNOSIS — E038 Other specified hypothyroidism: Secondary | ICD-10-CM | POA: Diagnosis not present

## 2018-03-07 DIAGNOSIS — R5382 Chronic fatigue, unspecified: Secondary | ICD-10-CM

## 2018-03-07 LAB — TSH: TSH: 5.32 u[IU]/mL — AB (ref 0.35–4.50)

## 2018-03-07 LAB — T4, FREE: FREE T4: 1.08 ng/dL (ref 0.60–1.60)

## 2018-03-07 MED ORDER — FLUTICASONE PROPIONATE 50 MCG/ACT NA SUSP: 2.0000 | Freq: Every day | NASAL | 0 refills | Status: DC

## 2018-03-07 NOTE — Progress Notes (Signed)
Patient presents to clinic today for testosterone level check due to chronic fatigue. 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 He is being supplemented with levothyroxine daily for this. Is taking as directed. Due for repeat TSH but was concerned that low T levels could be potentially contributing to fatigue.   Of note patient endorses an episode of lightheadedness at work on Monday. States he was busy working and noted it to be very hot in the room he was in. Had not been hydrating well. Felt clammy and nauseated, followed by a wave of lightheadedness requiring him to sit down. Denies chest pain, palpitations, vision changes or syncope. Was able to go the gym and workout later that day with no issue.   Past Medical History:  Diagnosis Date  . Anxiety and depression   . History of chickenpox     Current Outpatient Medications on File Prior to Visit  Medication Sig Dispense Refill  . cyanocobalamin 500 MCG tablet Take 500 mcg by mouth daily.    Marland Kitchen levothyroxine (SYNTHROID, LEVOTHROID) 50 MCG tablet Take 1 tablet (50 mcg total) by mouth daily. 30 tablet 1  . multivitamin (ONE-A-DAY MEN'S) TABS tablet Take 1 tablet by mouth daily.    Marland Kitchen tiZANidine (ZANAFLEX) 4 MG capsule Take 1 capsule (4 mg total) by mouth 3 (three) times daily as needed for muscle spasms. 30 capsule 0  . OIL OF [place] PO Take by mouth.     No current facility-administered medications on file prior to visit.     No Known Allergies  Family History  Problem Relation Age of Onset  . Healthy Mother   . Healthy Father   . Lupus Sister   . Thyroid disease Sister   . Lung cancer Maternal Grandfather        Smoker  . AAA (abdominal aortic aneurysm) Paternal Grandfather     Social History   Socioeconomic History  . Marital status: Married    Spouse name: Not on file  . Number of children: Not on file  . Years of education: Not on file  . Highest education level: Not on file  Occupational History  . Not on  file  Social Needs  . Financial resource strain: Not on file  . Food insecurity:    Worry: Not on file    Inability: Not on file  . Transportation needs:    Medical: Not on file    Non-medical: Not on file  Tobacco Use  . Smoking status: Never Smoker  . Smokeless tobacco: Never Used  Substance and Sexual Activity  . Alcohol use: Yes    Comment: occasional  . Drug use: No  . Sexual activity: Yes  Lifestyle  . Physical activity:    Days per week: Not on file    Minutes per session: Not on file  . Stress: Not on file  Relationships  . Social connections:    Talks on phone: Not on file    Gets together: Not on file    Attends religious service: Not on file    Active member of club or organization: Not on file    Attends meetings of clubs or organizations: Not on file    Relationship status: Not on file  Other Topics Concern  . Not on file  Social History Narrative  . Not on file   Review of Systems - See HPI.  All other ROS are negative.  BP 120/82   Pulse (!) 51   Temp 97.8 F (36.6 C) (  Oral)   Resp 16   Ht 6' (1.829 m)   Wt 216 lb (98 kg)   SpO2 98%   BMI 29.29 kg/m   Physical Exam  Constitutional: He is oriented to person, place, and time and well-developed, well-nourished, and in no distress.  HENT:  Head: Normocephalic and atraumatic.  Eyes: Conjunctivae are normal.  Neck: Neck supple.  Cardiovascular: Normal rate, regular rhythm, normal heart sounds and intact distal pulses.  Pulmonary/Chest: Effort normal and breath sounds normal. No respiratory distress. He has no wheezes. He has no rales. He exhibits no tenderness.  Neurological: He is alert and oriented to person, place, and time.  Skin: Skin is warm and dry. No rash noted.  Psychiatric: Affect normal.  Vitals reviewed.   Recent Results (from the past 2160 hour(s))  CBC w/Diff     Status: None   Collection Time: 02/12/18 10:33 AM  Result Value Ref Range   WBC 5.3 4.0 - 10.5 K/uL   RBC 5.12 4.22  - 5.81 Mil/uL   Hemoglobin 14.8 13.0 - 17.0 g/dL   HCT 43.5 39.0 - 52.0 %   MCV 84.9 78.0 - 100.0 fl   MCHC 34.1 30.0 - 36.0 g/dL   RDW 12.4 11.5 - 15.5 %   Platelets 177.0 150.0 - 400.0 K/uL   Neutrophils Relative % 54.0 43.0 - 77.0 %   Lymphocytes Relative 34.3 12.0 - 46.0 %   Monocytes Relative 7.2 3.0 - 12.0 %   Eosinophils Relative 3.8 0.0 - 5.0 %   Basophils Relative 0.7 0.0 - 3.0 %   Neutro Abs 2.9 1.4 - 7.7 K/uL   Lymphs Abs 1.8 0.7 - 4.0 K/uL   Monocytes Absolute 0.4 0.1 - 1.0 K/uL   Eosinophils Absolute 0.2 0.0 - 0.7 K/uL   Basophils Absolute 0.0 0.0 - 0.1 K/uL  Comp Met (CMET)     Status: Abnormal   Collection Time: 02/12/18 10:33 AM  Result Value Ref Range   Sodium 137 135 - 145 mEq/L   Potassium 4.3 3.5 - 5.1 mEq/L   Chloride 101 96 - 112 mEq/L   CO2 32 19 - 32 mEq/L   Glucose, Bld 80 70 - 99 mg/dL   BUN 24 (H) 6 - 23 mg/dL   Creatinine, Ser 1.20 0.40 - 1.50 mg/dL   Total Bilirubin 0.7 0.2 - 1.2 mg/dL   Alkaline Phosphatase 42 39 - 117 U/L   AST 17 0 - 37 U/L   ALT 17 0 - 53 U/L   Total Protein 7.0 6.0 - 8.3 g/dL   Albumin 4.6 3.5 - 5.2 g/dL   Calcium 9.8 8.4 - 10.5 mg/dL   GFR 69.57 >60.00 mL/min  TSH     Status: Abnormal   Collection Time: 02/12/18 10:33 AM  Result Value Ref Range   TSH 8.44 (H) 0.35 - 4.50 uIU/mL  Epstein-Barr virus VCA antibody panel     Status: Abnormal   Collection Time: 02/12/18 10:33 AM  Result Value Ref Range   EBV VCA IgM <36.00 U/mL    Comment:       U/mL              Interpretation       ----              --------------       <36.00            Negative       36.00-43.99       Equivocal       >  43.99            Positive    EBV VCA IgG >750.00 (H) U/mL    Comment:        U/mL             Interpretation        ----             --------------        <18.00           Negative        18.00-21.99      Equivocal        >21.99           Positive    EBV NA IgG 329.00 (H) U/mL    Comment:        U/mL             Interpretation         ----             --------------        <18.00           Negative        18.00-21.99      Equivocal        >21.99           Positive    Interpretation      Comment: . Suggestive of a past Epstein-Barr virus infection. In infants, a similar pattern may occur as a result of passive maternal transfer of antibody. .   CMV abs, IgG+IgM (cytomegalovirus)     Status: Abnormal   Collection Time: 02/12/18 10:33 AM  Result Value Ref Range   Cytomegalovirus Ab-IgG 6.40 (H) U/mL    Comment:                            U/mL         Interpretation                            -----         --------------                            <0.60         Negative                            0.60-0.69     Equivocal                            > or = 0.70   Positive . A positive result indicates that the patient has  antibody to CMV. It does not differentiate between an active or past infection.    CMV IgM <30.00 AU/mL    Comment:     AU/mL                 Interpretation     -----                 --------------     <30.00                No Antibody Detected     30.00-34.99           Equivocal     > or =  35.00          Antibody Detected . Results from any one IgM assay should not be used as a sole determinant of a current or recent infection.  Because an IgM test can yield false positive results and  low level IgM antibody may persist for more than 12  months post infection, reliance on a single test result  could be misleading. Acute infection is best diagnosed  by demonstrating the conversion of IgG from negative to  positive. If an acute infection is suspected, consider  obtaining a new specimen and submit for both IgG and IgM  testing in two or more weeks. Evonnie Pat antibodies     Status: None   Collection Time: 02/12/18 10:33 AM  Result Value Ref Range   B burgdorferi Ab IgG+IgM <0.90 index    Comment:                    Index                Interpretation                    -----                 --------------                    < 0.90               Negative                    0.90-1.09            Equivocal                    > 1.09               Positive . As recommended by the Food and Drug Administration  (FDA), all samples with positive or equivocal  results in a Borrelia burgdorferi antibody screen will be tested using a blot method. Positive or  equivocal screening test results should not be  interpreted as truly positive until verified as such  using a supplemental assay (e.g., B. burgdorferi blot). . The screening test and/or blot for B. burgdorferi  antibodies may be falsely negative in early stages of Lyme disease, including the period when erythema  migrans is apparent. .   B12     Status: None   Collection Time: 02/12/18 10:33 AM  Result Value Ref Range   Vitamin B-12 889 211 - 911 pg/mL  Vitamin D (25 hydroxy)     Status: None   Collection Time: 02/12/18 10:33 AM  Result Value Ref Range   VITD 32.83 30.00 - 100.00 ng/mL  Sedimentation rate     Status: None   Collection Time: 02/12/18 10:33 AM  Result Value Ref Range   Sed Rate 3 0 - 15 mm/hr  T3, free     Status: None   Collection Time: 02/13/18  2:10 PM  Result Value Ref Range   T3, Free 2.8 2.3 - 4.2 pg/mL  T4, free     Status: None   Collection Time: 02/13/18  2:10 PM  Result Value Ref Range   Free T4 1.05 0.60 - 1.60 ng/dL    Comment: Specimens from patients who are undergoing biotin therapy and /or ingesting biotin supplements may contain high levels of biotin.  The higher biotin concentration in these specimens  interferes with this Free T4 assay.  Specimens that contain high levels  of biotin may cause false high results for this Free T4 assay.  Please interpret results in light of the total clinical presentation of the patient.    TSH     Status: Abnormal   Collection Time: 02/18/18 10:03 AM  Result Value Ref Range   TSH 8.46 (H) 0.35 - 4.50 uIU/mL  Thyroid peroxidase antibody      Status: Abnormal   Collection Time: 02/18/18 10:03 AM  Result Value Ref Range   Thyroperoxidase Ab SerPl-aCnc 46 (H) <9 IU/mL  Antinuclear Antib (ANA)     Status: None   Collection Time: 02/18/18 10:03 AM  Result Value Ref Range   Anit Nuclear Antibody(ANA) NEGATIVE NEGATIVE    Comment: ANA IFA is a first line screen for detecting the presence of up to approximately 150 autoantibodies in various autoimmune diseases. A negative ANA IFA result suggests ANA-associated autoimmune diseases are not present at this time. . Visit Physician FAQs for interpretation of all antibodies in the [place], prevalence, and association with diseases at http://education.[facility].com/ EBV/PLW859 .     Assessment/Plan: 1. Chronic fatigue Will check Testosterone level today. - Testosterone Total,Free,Bio, Males-([facility])  2. Hypothyroidism due to Hashimoto's thyroiditis Repeat TSH and Free T4. Will likely have to increase dose of medication. Will alter based on results.  - TSH - T4, free  3. Vasovagal episode Exam unremarkable. Reassurance given today. Supportive measures including consistent hydration and food intake reviewed. Follow-up for any recurrence.    Leeanne Rio, PA-C

## 2018-03-07 NOTE — Patient Instructions (Signed)
Please go to the lab today for blood work.  I will call you with your results. We will alter treatment regimen(s) if indicated by your results.   Start the Flonase as directed over the next few days to see if this helps with the sensation in the throat.

## 2018-03-08 LAB — TESTOSTERONE TOTAL,FREE,BIO, MALES
Albumin: 4.4 g/dL (ref 3.6–5.1)
SEX HORMONE BINDING: 44 nmol/L (ref 10–50)
TESTOSTERONE BIOAVAILABLE: 128.9 ng/dL (ref >=110.0)
Testosterone, Free: 64 pg/mL (ref 46.0–224.0)
Testosterone: 590 ng/dL (ref 250–827)

## 2018-03-09 ENCOUNTER — Encounter: Payer: Self-pay | Admitting: Physician Assistant

## 2018-03-18 ENCOUNTER — Encounter: Payer: Self-pay | Admitting: Physician Assistant

## 2018-03-18 ENCOUNTER — Other Ambulatory Visit (INDEPENDENT_AMBULATORY_CARE_PROVIDER_SITE_OTHER): Payer: 59

## 2018-03-18 DIAGNOSIS — E063 Autoimmune thyroiditis: Secondary | ICD-10-CM

## 2018-03-19 ENCOUNTER — Other Ambulatory Visit: Payer: Self-pay | Admitting: Emergency Medicine

## 2018-03-19 LAB — TSH: TSH: 4.67 mIU/L — ABNORMAL HIGH (ref 0.40–4.50)

## 2018-03-19 MED ORDER — LEVOTHYROXINE SODIUM 88 MCG PO TABS: 88.0000 ug | ORAL_TABLET | Freq: Every day | ORAL | 1 refills | Status: DC

## 2018-05-01 ENCOUNTER — Other Ambulatory Visit: Payer: 59

## 2018-06-07 ENCOUNTER — Other Ambulatory Visit: Payer: Self-pay | Admitting: Physician Assistant

## 2018-06-07 ENCOUNTER — Encounter: Payer: Self-pay | Admitting: Emergency Medicine

## 2018-06-07 DIAGNOSIS — R7989 Other specified abnormal findings of blood chemistry: Secondary | ICD-10-CM

## 2018-06-08 DIAGNOSIS — Z23 Encounter for immunization: Secondary | ICD-10-CM | POA: Diagnosis not present

## 2018-07-03 ENCOUNTER — Other Ambulatory Visit: Payer: Self-pay | Admitting: Physician Assistant

## 2018-07-03 ENCOUNTER — Encounter: Payer: Self-pay | Admitting: Emergency Medicine

## 2018-07-03 NOTE — Telephone Encounter (Signed)
My chart message sent to patient to schedule a lab visit to recheck thyroid levels before refilling medication.

## 2018-07-12 ENCOUNTER — Telehealth: Payer: Self-pay | Admitting: Emergency Medicine

## 2018-07-12 NOTE — Telephone Encounter (Signed)
-----   Message from Waldon MerlWilliam C Martin, PA-C sent at 07/12/2018  7:22 AM EDT ----- Patient overdue for repeat thyroid studies. Please remind him and try to schedule him a lab appt.  ----- Message ----- From: SYSTEM Sent: 07/12/2018  12:07 AM To: Waldon MerlWilliam C Martin, PA-C

## 2018-07-12 NOTE — Telephone Encounter (Signed)
LMOVM advising patient he is due for recheck thyroid. Orders already in chart. Please schedule a lab visit. CRM created and ok for [facility] to schedule appointment.

## 2018-08-24 ENCOUNTER — Other Ambulatory Visit: Payer: Self-pay | Admitting: Physician Assistant

## 2018-08-26 NOTE — Telephone Encounter (Signed)
Patient is due for repeat thyroid level to make sure he is taking correct dose

## 2018-09-14 DIAGNOSIS — Z23 Encounter for immunization: Secondary | ICD-10-CM | POA: Diagnosis not present

## 2018-12-10 ENCOUNTER — Encounter: Payer: Self-pay | Admitting: Physician Assistant

## 2018-12-18 ENCOUNTER — Other Ambulatory Visit: Payer: Self-pay

## 2018-12-18 ENCOUNTER — Encounter: Payer: Self-pay | Admitting: Physician Assistant

## 2018-12-18 ENCOUNTER — Ambulatory Visit: Payer: 59 | Admitting: Physician Assistant

## 2018-12-18 VITALS — BP 110/70 | HR 62 | Temp 98.1°F | Resp 14 | Ht 72.0 in | Wt 217.0 lb

## 2018-12-18 DIAGNOSIS — J0100 Acute maxillary sinusitis, unspecified: Secondary | ICD-10-CM

## 2018-12-18 DIAGNOSIS — E039 Hypothyroidism, unspecified: Secondary | ICD-10-CM

## 2018-12-18 MED ORDER — AMOXICILLIN-POT CLAVULANATE 875-125 MG PO TABS: 1.0000 | ORAL_TABLET | Freq: Two times a day (BID) | ORAL | 0 refills | Status: DC

## 2018-12-18 MED ORDER — LEVOTHYROXINE SODIUM 88 MCG PO TABS: 88.0000 ug | ORAL_TABLET | Freq: Every day | ORAL | 1 refills | Status: DC

## 2018-12-18 NOTE — Patient Instructions (Signed)
Please go to the lab today for blood work.  I will call you with your results. We will alter treatment regimen(s) if indicated by your results.   Restart your thyroid medication.    Please take antibiotic as directed.  Increase fluid intake.  Use Saline nasal spray.  Take a daily multivitamin. Restart your Flonase.  Place a humidifier in the bedroom.  Please call or return clinic if symptoms are not improving.  Sinusitis Sinusitis is redness, soreness, and swelling (inflammation) of the paranasal sinuses. Paranasal sinuses are air pockets within the bones of your face (beneath the eyes, the middle of the forehead, or above the eyes). In healthy paranasal sinuses, mucus is able to drain out, and air is able to circulate through them by way of your nose. However, when your paranasal sinuses are inflamed, mucus and air can become trapped. This can allow bacteria and other germs to grow and cause infection. Sinusitis can develop quickly and last only a short time (acute) or continue over a long period (chronic). Sinusitis that lasts for more than 12 weeks is considered chronic.  CAUSES  Causes of sinusitis include:  Allergies.  Structural abnormalities, such as displacement of the cartilage that separates your nostrils (deviated septum), which can decrease the air flow through your nose and sinuses and affect sinus drainage.  Functional abnormalities, such as when the small hairs (cilia) that line your sinuses and help remove mucus do not work properly or are not present. SYMPTOMS  Symptoms of acute and chronic sinusitis are the same. The primary symptoms are pain and pressure around the affected sinuses. Other symptoms include:  Upper toothache.  Earache.  Headache.  Bad breath.  Decreased sense of smell and taste.  A cough, which worsens when you are lying flat.  Fatigue.  Fever.  Thick drainage from your nose, which often is green and may contain pus (purulent).  Swelling and  warmth over the affected sinuses. DIAGNOSIS  Your caregiver will perform a physical exam. During the exam, your caregiver may:  Look in your nose for signs of abnormal growths in your nostrils (nasal polyps).  Tap over the affected sinus to check for signs of infection.  View the inside of your sinuses (endoscopy) with a special imaging device with a light attached (endoscope), which is inserted into your sinuses. If your caregiver suspects that you have chronic sinusitis, one or more of the following tests may be recommended:  Allergy tests.  Nasal culture A sample of mucus is taken from your nose and sent to a lab and screened for bacteria.  Nasal cytology A sample of mucus is taken from your nose and examined by your caregiver to determine if your sinusitis is related to an allergy. TREATMENT  Most cases of acute sinusitis are related to a viral infection and will resolve on their own within 10 days. Sometimes medicines are prescribed to help relieve symptoms (pain medicine, decongestants, nasal steroid sprays, or saline sprays).  However, for sinusitis related to a bacterial infection, your caregiver will prescribe antibiotic medicines. These are medicines that will help kill the bacteria causing the infection.  Rarely, sinusitis is caused by a fungal infection. In theses cases, your caregiver will prescribe antifungal medicine. For some cases of chronic sinusitis, surgery is needed. Generally, these are cases in which sinusitis recurs more than 3 times per year, despite other treatments. HOME CARE INSTRUCTIONS   Drink plenty of water. Water helps thin the mucus so your sinuses can drain more  easily.  Use a humidifier.  Inhale steam 3 to 4 times a day (for example, sit in the bathroom with the shower running).  Apply a warm, moist washcloth to your face 3 to 4 times a day, or as directed by your caregiver.  Use saline nasal sprays to help moisten and clean your sinuses.  Take  over-the-counter or prescription medicines for pain, discomfort, or fever only as directed by your caregiver. SEEK IMMEDIATE MEDICAL CARE IF:  You have increasing pain or severe headaches.  You have nausea, vomiting, or drowsiness.  You have swelling around your face.  You have vision problems.  You have a stiff neck.  You have difficulty breathing. MAKE SURE YOU:   Understand these instructions.  Will watch your condition.  Will get help right away if you are not doing well or get worse. Document Released: 11/27/2005 Document Revised: 02/19/2012 Document Reviewed: 12/12/2011 [facility] Patient Information 2014 [employer] [place].

## 2018-12-18 NOTE — Progress Notes (Signed)
Patient presents to clinic today for follow-up of hypothyroidism as well as to discuss acute concerns.  Regarding hypothyroidism, patient is on a regimen of 88 mcg levothyroxine daily. Endorses taking as directed but ran out of medication a couple of weeks ago. Notes some fatigue since running out of medication. Denies weight gain, confusion, bradycardia, etc.   Patient also notes 3-4 weeks of nasal congestion, sinus pressure and pain along with headache and tooth pain bilaterally. Denies known feer. Denies chest congestion, chest pain or SOB. Has taken Ibuprofen for symptom relief. Is not taking his Flonase.Marland Kitchen.   Past Medical History:  Diagnosis Date  . Anxiety and depression   . History of chickenpox     Current Outpatient Medications on File Prior to Visit  Medication Sig Dispense Refill  . multivitamin (ONE-A-DAY MEN'S) TABS tablet Take 1 tablet by mouth daily.    . OIL OF OREGANO PO Take by mouth.    . cyanocobalamin 500 MCG tablet Take 500 mcg by mouth daily.    . fluticasone (FLONASE) 50 MCG/ACT nasal spray Place 2 sprays into both nostrils daily. (Patient not taking: Reported on 12/18/2018) 16 g 0  . levothyroxine (SYNTHROID, LEVOTHROID) 88 MCG tablet Take 1 tablet (88 mcg total) by mouth daily. Please schedule lab appointment to recheck thyroid level (Patient not taking: Reported on 12/18/2018) 15 tablet 0   No current facility-administered medications on file prior to visit.     No Known Allergies  Family History  Problem Relation Age of Onset  . Healthy Mother   . Healthy Father   . Lupus Sister   . Thyroid disease Sister   . Lung cancer Maternal Grandfather        Smoker  . AAA (abdominal aortic aneurysm) Paternal Grandfather     Social History   Socioeconomic History  . Marital status: Married    Spouse name: Not on file  . Number of children: Not on file  . Years of education: Not on file  . Highest education level: Not on file  Occupational History  . Not on  file  Social Needs  . Financial resource strain: Not on file  . Food insecurity:    Worry: Not on file    Inability: Not on file  . Transportation needs:    Medical: Not on file    Non-medical: Not on file  Tobacco Use  . Smoking status: Never Smoker  . Smokeless tobacco: Never Used  Substance and Sexual Activity  . Alcohol use: Yes    Comment: occasional  . Drug use: No  . Sexual activity: Yes  Lifestyle  . Physical activity:    Days per week: Not on file    Minutes per session: Not on file  . Stress: Not on file  Relationships  . Social connections:    Talks on phone: Not on file    Gets together: Not on file    Attends religious service: Not on file    Active member of club or organization: Not on file    Attends meetings of clubs or organizations: Not on file    Relationship status: Not on file  Other Topics Concern  . Not on file  Social History Narrative  . Not on file   Review of Systems - See HPI.  All other ROS are negative.  BP 110/70   Pulse 62   Temp 98.1 F (36.7 C) (Oral)   Resp 14   Ht 6' (1.829 m)  Wt 217 lb (98.4 kg)   SpO2 98%   BMI 29.43 kg/m   Physical Exam Vitals signs reviewed.  Constitutional:      Appearance: Normal appearance.  HENT:     Head: Normocephalic and atraumatic.     Right Ear: Tympanic membrane normal.     Left Ear: Tympanic membrane normal.     Nose: Congestion present.     Right Turbinates: Enlarged.     Left Turbinates: Enlarged.     Right Sinus: Maxillary sinus tenderness present.     Left Sinus: Maxillary sinus tenderness present.     Mouth/Throat:     Mouth: Mucous membranes are moist.  Eyes:     Conjunctiva/sclera: Conjunctivae normal.     Pupils: Pupils are equal, round, and reactive to light.  Neck:     Musculoskeletal: Normal range of motion and neck supple.  Cardiovascular:     Rate and Rhythm: Normal rate and regular rhythm.     Pulses: Normal pulses.     Heart sounds: Normal heart sounds.    Pulmonary:     Effort: Pulmonary effort is normal.     Breath sounds: Normal breath sounds.  Neurological:     Mental Status: He is alert.      Assessment/Plan: 1. Acute non-recurrent maxillary sinusitis Rx Augmentin.  Increase fluids.  Rest.  Saline nasal spray.  Probiotic.  Mucinex as directed.  Humidifier in bedroom. Restart Flonase.  Call or return to clinic if symptoms are not improving.  - amoxicillin-clavulanate (AUGMENTIN) 875-125 MG tablet; Take 1 tablet by mouth 2 (two) times daily.  Dispense: 14 tablet; Refill: 0  2. Hypothyroidism, unspecified type Refilled medication. Repeat TSH. Will check again in 4-6 weeks and alter medication dose accordingly. - TSH   Piedad ClimesWilliam Cody Ivett Luebbe, PA-C

## 2018-12-19 ENCOUNTER — Other Ambulatory Visit: Payer: Self-pay | Admitting: *Deleted

## 2018-12-19 DIAGNOSIS — E039 Hypothyroidism, unspecified: Secondary | ICD-10-CM

## 2018-12-19 LAB — TSH: TSH: 9.22 u[IU]/mL — ABNORMAL HIGH (ref 0.35–4.50)

## 2019-02-13 ENCOUNTER — Other Ambulatory Visit: Payer: Self-pay | Admitting: Physician Assistant

## 2019-02-13 ENCOUNTER — Encounter: Payer: Self-pay | Admitting: Emergency Medicine

## 2019-06-26 ENCOUNTER — Encounter: Payer: Self-pay | Admitting: Physician Assistant

## 2019-06-26 NOTE — Telephone Encounter (Signed)
Please call patient to triage to make sure no alarm symptoms present. If stable, please schedule for in-office visit. If he is completely out of thyroid medication, ok to give 1 month supply so he can restart ASAP.

## 2019-06-27 ENCOUNTER — Telehealth: Payer: Self-pay

## 2019-06-27 NOTE — Telephone Encounter (Signed)
LM requesting call back to discuss symptoms and schedule in person OV with PCP.

## 2019-06-30 ENCOUNTER — Encounter: Payer: Self-pay | Admitting: Physician Assistant

## 2019-06-30 ENCOUNTER — Ambulatory Visit: Payer: 59 | Admitting: Physician Assistant

## 2019-06-30 ENCOUNTER — Other Ambulatory Visit: Payer: Self-pay

## 2019-06-30 VITALS — BP 110/70 | HR 49 | Temp 99.6°F | Resp 16 | Ht 72.0 in | Wt 209.0 lb

## 2019-06-30 DIAGNOSIS — R42 Dizziness and giddiness: Secondary | ICD-10-CM

## 2019-06-30 DIAGNOSIS — E039 Hypothyroidism, unspecified: Secondary | ICD-10-CM | POA: Diagnosis not present

## 2019-06-30 DIAGNOSIS — R001 Bradycardia, unspecified: Secondary | ICD-10-CM

## 2019-06-30 MED ORDER — LEVOTHYROXINE SODIUM 88 MCG PO TABS: ORAL_TABLET | ORAL | 1 refills | Status: DC

## 2019-06-30 NOTE — Progress Notes (Signed)
Patient presents to clinic today for follow-up of hypothyroidism and symptoms of lightheadedness and balance issue over the past 10 days. Patient notes feeling lightheaded intermittently regardless of what he is doing. Notes heart rate has been lower than normal. Denies palpitations, SOB, diaphoresis. Notes some increase in caffeine intake recently. Is not hydrating the best. Notes heat seems to make symptoms worse. Denies positional component to symptoms. Has not taken his levothyroxine in about 6 months. Last TSH is > 9.   Past Medical History:  Diagnosis Date  . Anxiety and depression   . History of chickenpox     Current Outpatient Medications on File Prior to Visit  Medication Sig Dispense Refill  . cyanocobalamin 500 MCG tablet Take 500 mcg by mouth daily.    . multivitamin (ONE-A-DAY MEN'S) TABS tablet Take 1 tablet by mouth daily.    Marland Kitchen levothyroxine (SYNTHROID, LEVOTHROID) 88 MCG tablet TAKE 1 TABLET BY MOUTH DAILY. PLEASE SCHEDULE LAB APPOINTMENT TO RECHECK THYROID LEVEL (Patient not taking: Reported on 06/30/2019) 30 tablet 0  . OIL OF OREGANO PO Take by mouth.     No current facility-administered medications on file prior to visit.     No Known Allergies  Family History  Problem Relation Age of Onset  . Healthy Mother   . Healthy Father   . Lupus Sister   . Thyroid disease Sister   . Lung cancer Maternal Grandfather        Smoker  . AAA (abdominal aortic aneurysm) Paternal Grandfather     Social History   Socioeconomic History  . Marital status: Married    Spouse name: Not on file  . Number of children: Not on file  . Years of education: Not on file  . Highest education level: Not on file  Occupational History  . Not on file  Social Needs  . Financial resource strain: Not on file  . Food insecurity    Worry: Not on file    Inability: Not on file  . Transportation needs    Medical: Not on file    Non-medical: Not on file  Tobacco Use  . Smoking status:  Never Smoker  . Smokeless tobacco: Never Used  Substance and Sexual Activity  . Alcohol use: Yes    Comment: occasional  . Drug use: No  . Sexual activity: Yes  Lifestyle  . Physical activity    Days per week: Not on file    Minutes per session: Not on file  . Stress: Not on file  Relationships  . Social Herbalist on phone: Not on file    Gets together: Not on file    Attends religious service: Not on file    Active member of club or organization: Not on file    Attends meetings of clubs or organizations: Not on file    Relationship status: Not on file  Other Topics Concern  . Not on file  Social History Narrative  . Not on file   Review of Systems - See HPI.  All other ROS are negative.  BP 110/70   Pulse (!) 49   Temp 99.6 F (37.6 C) (Skin)   Resp 16   Ht 6' (1.829 m)   Wt 209 lb (94.8 kg)   SpO2 99%   BMI 28.35 kg/m   Physical Exam Vitals signs reviewed.  Constitutional:      Appearance: Normal appearance.  HENT:     Head: Normocephalic and atraumatic.  Right Ear: Tympanic membrane normal.     Left Ear: Tympanic membrane normal.     Nose: Nose normal.     Mouth/Throat:     Mouth: Mucous membranes are moist.  Eyes:     Conjunctiva/sclera: Conjunctivae normal.     Pupils: Pupils are equal, round, and reactive to light.  Cardiovascular:     Rate and Rhythm: Bradycardia present.     Heart sounds: Normal heart sounds.  Pulmonary:     Effort: Pulmonary effort is normal.     Breath sounds: Normal breath sounds.  Abdominal:     Palpations: Abdomen is soft.  Neurological:     General: No focal deficit present.     Mental Status: He is alert and oriented to person, place, and time.     Cranial Nerves: No cranial nerve deficit.     Coordination: Coordination normal.     Deep Tendon Reflexes: Reflexes normal.  Psychiatric:        Mood and Affect: Mood normal.        Behavior: Behavior normal.     Assessment/Plan: 1. Lightheadedness 2.  Hypothyroidism, unspecified type 3. Bradycardia Noncompliant with thyroid medications. Has history of lower heart rate thought to be combination of hypothyroidism and his fitness status. Has decreased further. No SOB but having lightheadedness. EKG with sinus bradycardia. Will check CBC, CMP and repeat TSH today. He is to immediately restart his levothyroxine today. Will watch things very closely over the next couple of weeks as he is stable. If symptoms are not improving or anything worsens, he will need ER evaluation.  - CBC w/Diff - Comp Met (CMET) - TSH - EKG 12-Lead   Leeanne Rio, PA-C

## 2019-06-30 NOTE — Telephone Encounter (Signed)
error 

## 2019-06-30 NOTE — Patient Instructions (Signed)
Please keep well-hydrated and get plenty of rest.  Be mindful of caffeine. No alcohol. We need to increase your heart rate and the best way is via getting the thyroid treated. It is very important that you take this medication every day without fail. Refills have been sent in. This is something you are going to require from here on out.  If you note any worsening symptoms at all, I need you to go to the ER.   Follow-up with me in 2 weeks for reassessment. We will recheck thyroid levels again in 4-6 weeks.    Hypothyroidism  Hypothyroidism is when the thyroid gland does not make enough of certain hormones (it is underactive). The thyroid gland is a small gland located in the lower front part of the neck, just in front of the windpipe (trachea). This gland makes hormones that help control how the body uses food for energy (metabolism) as well as how the heart and brain function. These hormones also play a role in keeping your bones strong. When the thyroid is underactive, it produces too little of the hormones thyroxine (T4) and triiodothyronine (T3). What are the causes? This condition may be caused by:  Hashimoto's disease. This is a disease in which the body's disease-fighting system (immune system) attacks the thyroid gland. This is the most common cause.  Viral infections.  Pregnancy.  Certain medicines.  Birth defects.  Past radiation treatments to the head or neck for cancer.  Past treatment with radioactive iodine.  Past exposure to radiation in the environment.  Past surgical removal of part or all of the thyroid.  Problems with a gland in the center of the brain (pituitary gland).  Lack of enough iodine in the diet. What increases the risk? You are more likely to develop this condition if:  You are male.  You have a family history of thyroid conditions.  You use a medicine called lithium.  You take medicines that affect the immune system (immunosuppressants).  What are the signs or symptoms? Symptoms of this condition include:  Feeling as though you have no energy (lethargy).  Not being able to tolerate cold.  Weight gain that is not explained by a change in diet or exercise habits.  Lack of appetite.  Dry skin.  Coarse hair.  Menstrual irregularity.  Slowing of thought processes.  Constipation.  Sadness or depression. How is this diagnosed? This condition may be diagnosed based on:  Your symptoms, your medical history, and a physical exam.  Blood tests. You may also have imaging tests, such as an ultrasound or MRI. How is this treated? This condition is treated with medicine that replaces the thyroid hormones that your body does not make. After you begin treatment, it may take several weeks for symptoms to go away. Follow these instructions at home:  Take over-the-counter and prescription medicines only as told by your health care provider.  If you start taking any new medicines, tell your health care provider.  Keep all follow-up visits as told by your health care provider. This is important. ? As your condition improves, your dosage of thyroid hormone medicine may change. ? You will need to have blood tests regularly so that your health care provider can monitor your condition. Contact a health care provider if:  Your symptoms do not get better with treatment.  You are taking thyroid replacement medicine and you: ? Sweat a lot. ? Have tremors. ? Feel anxious. ? Lose weight rapidly. ? Cannot tolerate heat. ?  Have emotional swings. ? Have diarrhea. ? Feel weak. Get help right away if you have:  Chest pain.  An irregular heartbeat.  A rapid heartbeat.  Difficulty breathing. Summary  Hypothyroidism is when the thyroid gland does not make enough of certain hormones (it is underactive).  When the thyroid is underactive, it produces too little of the hormones thyroxine (T4) and triiodothyronine (T3).  The  most common cause is Hashimoto's disease, a disease in which the body's disease-fighting system (immune system) attacks the thyroid gland. The condition can also be caused by viral infections, medicine, pregnancy, or past radiation treatment to the head or neck.  Symptoms may include weight gain, dry skin, constipation, feeling as though you do not have energy, and not being able to tolerate cold.  This condition is treated with medicine to replace the thyroid hormones that your body does not make. This information is not intended to replace advice given to you by your health care provider. Make sure you discuss any questions you have with your health care provider. Document Released: 11/27/2005 Document Revised: 11/09/2017 Document Reviewed: 11/07/2017 Elsevier Patient Education  2020 [employer]

## 2019-07-01 ENCOUNTER — Encounter: Payer: Self-pay | Admitting: Physician Assistant

## 2019-07-01 LAB — CBC WITH DIFFERENTIAL/PLATELET
Basophils Absolute: 0 10*3/uL (ref 0.0–0.1)
Basophils Relative: 0.6 % (ref 0.0–3.0)
Eosinophils Absolute: 0.2 10*3/uL (ref 0.0–0.7)
Eosinophils Relative: 2.5 % (ref 0.0–5.0)
HCT: 44.6 % (ref 39.0–52.0)
Hemoglobin: 15 g/dL (ref 13.0–17.0)
Lymphocytes Relative: 29.9 % (ref 12.0–46.0)
Lymphs Abs: 1.9 10*3/uL (ref 0.7–4.0)
MCHC: 33.5 g/dL (ref 30.0–36.0)
MCV: 86.2 fl (ref 78.0–100.0)
Monocytes Absolute: 0.4 10*3/uL (ref 0.1–1.0)
Monocytes Relative: 6.5 % (ref 3.0–12.0)
Neutro Abs: 3.9 10*3/uL (ref 1.4–7.7)
Neutrophils Relative %: 60.5 % (ref 43.0–77.0)
Platelets: 191 10*3/uL (ref 150.0–400.0)
RBC: 5.18 Mil/uL (ref 4.22–5.81)
RDW: 12.5 % (ref 11.5–15.5)
WBC: 6.4 10*3/uL (ref 4.0–10.5)

## 2019-07-01 LAB — COMPREHENSIVE METABOLIC PANEL
ALT: 25 U/L (ref 0–53)
AST: 21 U/L (ref 0–37)
Albumin: 4.7 g/dL (ref 3.5–5.2)
Alkaline Phosphatase: 46 U/L (ref 39–117)
BUN: 22 mg/dL (ref 6–23)
CO2: 25 mEq/L (ref 19–32)
Calcium: 8.9 mg/dL (ref 8.4–10.5)
Chloride: 102 mEq/L (ref 96–112)
Creatinine, Ser: 1.36 mg/dL (ref 0.40–1.50)
GFR: 56.31 mL/min — ABNORMAL LOW (ref >60.00)
Glucose, Bld: 90 mg/dL (ref 70–99)
Potassium: 4 mEq/L (ref 3.5–5.1)
Sodium: 137 mEq/L (ref 135–145)
Total Bilirubin: 0.7 mg/dL (ref 0.2–1.2)
Total Protein: 6.6 g/dL (ref 6.0–8.3)

## 2019-07-01 LAB — TSH: TSH: 5.64 u[IU]/mL — ABNORMAL HIGH (ref 0.35–4.50)

## 2019-08-29 ENCOUNTER — Encounter: Payer: Self-pay | Admitting: Physician Assistant

## 2019-08-29 ENCOUNTER — Other Ambulatory Visit: Payer: Self-pay

## 2019-08-29 DIAGNOSIS — Z20822 Contact with and (suspected) exposure to covid-19: Secondary | ICD-10-CM

## 2019-08-30 LAB — NOVEL CORONAVIRUS, NAA: SARS-CoV-2, NAA: NOT DETECTED

## 2019-10-01 ENCOUNTER — Other Ambulatory Visit: Payer: Self-pay

## 2019-10-01 DIAGNOSIS — Z20822 Contact with and (suspected) exposure to covid-19: Secondary | ICD-10-CM

## 2019-10-02 ENCOUNTER — Encounter: Payer: Self-pay | Admitting: Physician Assistant

## 2019-10-02 LAB — NOVEL CORONAVIRUS, NAA: SARS-CoV-2, NAA: NOT DETECTED

## 2019-10-13 ENCOUNTER — Other Ambulatory Visit: Payer: Self-pay | Admitting: Physician Assistant

## 2019-10-13 ENCOUNTER — Encounter: Payer: Self-pay | Admitting: Emergency Medicine

## 2019-10-13 NOTE — Telephone Encounter (Signed)
My chart message sent to patient to verify if he is still taking medication or ran out

## 2019-10-20 ENCOUNTER — Encounter: Payer: Self-pay | Admitting: Physician Assistant

## 2019-10-20 ENCOUNTER — Ambulatory Visit: Payer: 59 | Admitting: Physician Assistant

## 2019-10-20 ENCOUNTER — Other Ambulatory Visit: Payer: Self-pay

## 2019-10-20 VITALS — BP 110/64 | HR 48 | Temp 98.2°F | Resp 13 | Ht 72.0 in | Wt 213.0 lb

## 2019-10-20 DIAGNOSIS — E039 Hypothyroidism, unspecified: Secondary | ICD-10-CM | POA: Diagnosis not present

## 2019-10-20 DIAGNOSIS — R42 Dizziness and giddiness: Secondary | ICD-10-CM

## 2019-10-20 DIAGNOSIS — M5090 Cervical disc disorder, unspecified, unspecified cervical region: Secondary | ICD-10-CM | POA: Diagnosis not present

## 2019-10-20 DIAGNOSIS — B351 Tinea unguium: Secondary | ICD-10-CM

## 2019-10-20 LAB — COMPREHENSIVE METABOLIC PANEL
ALT: 22 U/L (ref 0–53)
AST: 19 U/L (ref 0–37)
Albumin: 4.6 g/dL (ref 3.5–5.2)
Alkaline Phosphatase: 45 U/L (ref 39–117)
BUN: 23 mg/dL (ref 6–23)
CO2: 29 mEq/L (ref 19–32)
Calcium: 9.1 mg/dL (ref 8.4–10.5)
Chloride: 102 mEq/L (ref 96–112)
Creatinine, Ser: 1.08 mg/dL (ref 0.40–1.50)
GFR: 73.37 mL/min (ref >60.00)
Glucose, Bld: 82 mg/dL (ref 70–99)
Potassium: 4.7 mEq/L (ref 3.5–5.1)
Sodium: 138 mEq/L (ref 135–145)
Total Bilirubin: 0.8 mg/dL (ref 0.2–1.2)
Total Protein: 6.7 g/dL (ref 6.0–8.3)

## 2019-10-20 LAB — TSH: TSH: 5.75 u[IU]/mL — ABNORMAL HIGH (ref 0.35–4.50)

## 2019-10-20 LAB — MAGNESIUM: Magnesium: 2.1 mg/dL (ref 1.5–2.5)

## 2019-10-20 MED ORDER — TERBINAFINE HCL 250 MG PO TABS: 250.0000 mg | ORAL_TABLET | Freq: Every day | ORAL | 0 refills | Status: DC

## 2019-10-20 NOTE — Patient Instructions (Signed)
Please go to the lab today for blood work.  I will call you with your results. We will alter treatment regimen(s) if indicated by your results.   Please start the Terbinafine taking as directed once daily.  Follow-up in 4-5 weeks for reassessment and so we can recheck liver function.  Start a soak of the nails in equal parts warm water and distilled white vinegar. Do this for 10-15 minutes a couple of times per week.   Please start a daily multivitamin with magnesium. Keep well-hydrated. Consider an electrolyte water Ansel Bong, [employer], [employer]) once daily.   You will be contacted for assessment by Neurosurgery. If you do not hear from them within a week, please let me know.

## 2019-10-20 NOTE — Progress Notes (Signed)
Patient presents to clinic today c/o hypothyroidism follow-up. Managed with levothyroxine 88 mcg, taking as directed first thing in the morning. Of note, patient has not been taking consistently in the previous months per medication refill data.  Endorses occasional lightheadedness without dizziness. Endorses occasional fatigue. Notes sensitivity to heat. Reports eating well and staying hydrated. Denies changes to vision, chest pain, changes to bowel habits, diaphoresis.   Also notes nail fungus to left ring finger, ongoing issue, treated with Terbinifine in the past with success.   Past Medical History:  Diagnosis Date  . Anxiety and depression   . History of chickenpox     Current Outpatient Medications on File Prior to Visit  Medication Sig Dispense Refill  . cyanocobalamin 500 MCG tablet Take 500 mcg by mouth daily.    Marland Kitchen levothyroxine (SYNTHROID) 88 MCG tablet TAKE 1 TABLET BY MOUTH EVERY DAY. PLEASE SCHEDULE A LAB VISIT TO RECHECK THYROID. NO MORE REFILLS 30 tablet 0  . multivitamin (ONE-A-DAY MEN'S) TABS tablet Take 1 tablet by mouth daily.    . OIL OF OREGANO PO Take by mouth.     No current facility-administered medications on file prior to visit.     No Known Allergies  Family History  Problem Relation Age of Onset  . Healthy Mother   . Healthy Father   . Lupus Sister   . Thyroid disease Sister   . Lung cancer Maternal Grandfather        Smoker  . AAA (abdominal aortic aneurysm) Paternal Grandfather     Social History   Socioeconomic History  . Marital status: Married    Spouse name: Not on file  . Number of children: Not on file  . Years of education: Not on file  . Highest education level: Not on file  Occupational History  . Not on file  Social Needs  . Financial resource strain: Not on file  . Food insecurity    Worry: Not on file    Inability: Not on file  . Transportation needs    Medical: Not on file    Non-medical: Not on file  Tobacco Use  .  Smoking status: Never Smoker  . Smokeless tobacco: Never Used  Substance and Sexual Activity  . Alcohol use: Yes    Comment: occasional  . Drug use: No  . Sexual activity: Yes  Lifestyle  . Physical activity    Days per week: Not on file    Minutes per session: Not on file  . Stress: Not on file  Relationships  . Social Herbalist on phone: Not on file    Gets together: Not on file    Attends religious service: Not on file    Active member of club or organization: Not on file    Attends meetings of clubs or organizations: Not on file    Relationship status: Not on file  Other Topics Concern  . Not on file  Social History Narrative  . Not on file    Review of Systems - See HPI.  All other ROS are negative.  There were no vitals taken for this visit.  Physical Exam Vitals signs reviewed.  HENT:     Head: Normocephalic and atraumatic.     Right Ear: Tympanic membrane normal.     Left Ear: Tympanic membrane normal.  Neck:     Musculoskeletal: Neck supple.  Cardiovascular:     Rate and Rhythm: Regular rhythm. Bradycardia present.  Pulses: Normal pulses.     Heart sounds: Normal heart sounds.  Pulmonary:     Effort: Pulmonary effort is normal.     Breath sounds: Normal breath sounds.  Skin:      Neurological:     Mental Status: He is alert and oriented to person, place, and time.  Psychiatric:        Mood and Affect: Mood normal.     Recent Results (from the past 2160 hour(s))  Novel Coronavirus, NAA ([facility])     Status: None   Collection Time: 08/29/19 12:00 AM   Specimen: Oropharyngeal(OP) collection in vial transport medium   OROPHARYNGEA  TESTING  Result Value Ref Range   SARS-CoV-2, NAA Not Detected Not Detected    Comment: Testing was performed using the cobas(R) SARS-CoV-2 test. This nucleic acid amplification test was developed and its performance characteristics determined by [employer]. Nucleic acid amplification tests  include PCR and TMA. This test has not been FDA cleared or approved. This test has been authorized by FDA under an Emergency Use Authorization (EUA). This test is only authorized for the duration of time the declaration that circumstances exist justifying the authorization of the emergency use of in vitro diagnostic tests for detection of SARS-CoV-2 virus and/or diagnosis of COVID-19 infection under section 564(b)(1) of the Act, 21 U.S.C. 092HVF-4(B) (1), unless the authorization is terminated or revoked sooner. When diagnostic testing is negative, the possibility of a false negative result should be considered in the context of a patient's recent exposures and the presence of clinical signs and symptoms consistent with COVID-19. An individual without symptoms  of COVID-19 and who is not shedding SARS-CoV-2 virus would expect to have a negative (not detected) result in this assay.   Novel Coronavirus, NAA ([facility])     Status: None   Collection Time: 10/01/19 10:30 AM   Specimen: Nasopharyngeal(NP) swabs in vial transport medium   NASOPHARYNGE  TESTING  Result Value Ref Range   SARS-CoV-2, NAA Not Detected Not Detected    Comment: Testing was performed using the cobas(R) SARS-CoV-2 test. This nucleic acid amplification test was developed and its performance characteristics determined by [employer]. Nucleic acid amplification tests include PCR and TMA. This test has not been FDA cleared or approved. This test has been authorized by FDA under an Emergency Use Authorization (EUA). This test is only authorized for the duration of time the declaration that circumstances exist justifying the authorization of the emergency use of in vitro diagnostic tests for detection of SARS-CoV-2 virus and/or diagnosis of COVID-19 infection under section 564(b)(1) of the Act, 21 U.S.C. 340ZJQ-9(U) (1), unless the authorization is terminated or revoked sooner. When diagnostic testing is  negative, the possibility of a false negative result should be considered in the context of a patient's recent exposures and the presence of clinical signs and symptoms consistent with COVID-19. An individual without symptoms  of COVID-19 and who is not shedding SARS-CoV-2 virus would expect to have a negative (not detected) result in this assay.    Assessment/Plan: 1. Hypothyroidism, unspecified type On levothyroxine 88 mcg, inconsistent with taking medication daily. Encouraged patient to take medications everyday as directed. Continues to experience occasional lightheadedness, will recheck TSH today, may consider adjusting dose pending results.  - TSH  2. Lightheadedness Intermittent, denies dizziness. Patient endorses eating well and staying hydrated. Recommend he start daily multivitamin with magnesium. Will check labs today to evaluate electrolytes.  - Magnesium - Comp Met (CMET)  3. Onychomycosis Nail thickening  and yellow discoloration of left ring finger. Has history of onychomycosis treated with Lamisil. Recommended he soak the nail in equal parts warm water and distilled white vinegar several times per week. Will start him on Lamisil 250 mg QD with follow up in 4-5 weeks for reassessment and and liver function recheck.   4. Cervical disc disease History of cervical disc degeneration. Endorses having cervical pain daily. Would like for him to follow up with neurosurgery for further evaluation and treatment options.  - Ambulatory referral to Neurosurgery   Leeanne Rio, PA-C

## 2019-10-21 ENCOUNTER — Other Ambulatory Visit: Payer: Self-pay

## 2019-10-21 DIAGNOSIS — E039 Hypothyroidism, unspecified: Secondary | ICD-10-CM

## 2019-10-21 MED ORDER — LEVOTHYROXINE SODIUM 100 MCG PO TABS: 100.0000 ug | ORAL_TABLET | Freq: Every day | ORAL | 1 refills | Status: DC

## 2019-11-29 ENCOUNTER — Other Ambulatory Visit: Payer: Self-pay | Admitting: Physician Assistant

## 2019-12-01 ENCOUNTER — Other Ambulatory Visit: Payer: Self-pay

## 2019-12-01 ENCOUNTER — Ambulatory Visit (INDEPENDENT_AMBULATORY_CARE_PROVIDER_SITE_OTHER): Payer: 59

## 2019-12-01 DIAGNOSIS — E039 Hypothyroidism, unspecified: Secondary | ICD-10-CM

## 2019-12-02 ENCOUNTER — Other Ambulatory Visit: Payer: Self-pay | Admitting: Physician Assistant

## 2019-12-02 ENCOUNTER — Encounter: Payer: Self-pay | Admitting: Physician Assistant

## 2019-12-02 LAB — TSH: TSH: 3.5 u[IU]/mL (ref 0.35–4.50)

## 2019-12-03 ENCOUNTER — Other Ambulatory Visit: Payer: Self-pay | Admitting: Emergency Medicine

## 2019-12-03 DIAGNOSIS — E039 Hypothyroidism, unspecified: Secondary | ICD-10-CM

## 2019-12-04 MED ORDER — TERBINAFINE HCL 250 MG PO TABS: 250.0000 mg | ORAL_TABLET | Freq: Every day | ORAL | 0 refills | Status: DC

## 2020-03-08 ENCOUNTER — Other Ambulatory Visit: Payer: Self-pay | Admitting: Physician Assistant

## 2020-04-30 ENCOUNTER — Encounter: Payer: Self-pay | Admitting: Physician Assistant

## 2020-06-23 ENCOUNTER — Ambulatory Visit (INDEPENDENT_AMBULATORY_CARE_PROVIDER_SITE_OTHER): Payer: 59 | Admitting: Physician Assistant

## 2020-06-23 ENCOUNTER — Other Ambulatory Visit: Payer: Self-pay

## 2020-06-23 ENCOUNTER — Encounter: Payer: Self-pay | Admitting: Physician Assistant

## 2020-06-23 VITALS — BP 110/70 | HR 54 | Temp 98.4°F | Resp 16 | Ht 72.0 in | Wt 214.0 lb

## 2020-06-23 DIAGNOSIS — G8929 Other chronic pain: Secondary | ICD-10-CM

## 2020-06-23 DIAGNOSIS — M542 Cervicalgia: Secondary | ICD-10-CM

## 2020-06-23 DIAGNOSIS — E039 Hypothyroidism, unspecified: Secondary | ICD-10-CM

## 2020-06-23 LAB — CBC WITH DIFFERENTIAL/PLATELET
Basophils Absolute: 0 10*3/uL (ref 0.0–0.1)
Basophils Relative: 0.6 % (ref 0.0–3.0)
Eosinophils Absolute: 0.2 10*3/uL (ref 0.0–0.7)
Eosinophils Relative: 3.2 % (ref 0.0–5.0)
HCT: 43.8 % (ref 39.0–52.0)
Hemoglobin: 15 g/dL (ref 13.0–17.0)
Lymphocytes Relative: 33.3 % (ref 12.0–46.0)
Lymphs Abs: 1.8 10*3/uL (ref 0.7–4.0)
MCHC: 34.2 g/dL (ref 30.0–36.0)
MCV: 84.7 fl (ref 78.0–100.0)
Monocytes Absolute: 0.4 10*3/uL (ref 0.1–1.0)
Monocytes Relative: 7.6 % (ref 3.0–12.0)
Neutro Abs: 3 10*3/uL (ref 1.4–7.7)
Neutrophils Relative %: 55.3 % (ref 43.0–77.0)
Platelets: 175 10*3/uL (ref 150.0–400.0)
RBC: 5.17 Mil/uL (ref 4.22–5.81)
RDW: 12.5 % (ref 11.5–15.5)
WBC: 5.4 10*3/uL (ref 4.0–10.5)

## 2020-06-23 LAB — TSH: TSH: 4.8 u[IU]/mL — ABNORMAL HIGH (ref 0.35–4.50)

## 2020-06-23 NOTE — Patient Instructions (Addendum)
Giving how long this has been going on we are going to get a further assessment of things. I want to get a CT of your neck and also a x-ray of your TMJ joints to further assess things.  Please go to the lab today for blood work. I will call you with your results. We will alter treatment regimen(s) if indicated by your results.

## 2020-06-23 NOTE — Progress Notes (Signed)
Patient presents to clinic today to discuss ongoing R-sided neck pain x 6+ years. Describes neck pain as tension, stiffness and causing headache. Occurs daily now, in the afternoon. Notes associated swelling under the angle of his jaw on the -side, noting it is the size and texture of a bean. States this resolves overnight. Denies dental pain. Does note ear pain with this and post-auricular pain. Has seen prior PCP, Neurology and Neurosurgery for this, s/p MRI that revealed some cervical disc disease but no herniation. Has also had multiple rounds of PT without any improvement. Notes he works out daily without any pain or decreased ROM. Takes Aleve for pain which he does state helps. Denies symptoms presently.   Past Medical History:  Diagnosis Date  . Anxiety and depression   . History of chickenpox     Current Outpatient Medications on File Prior to Visit  Medication Sig Dispense Refill  . cyanocobalamin 500 MCG tablet Take 500 mcg by mouth daily.    Marland Kitchen levothyroxine (SYNTHROID) 100 MCG tablet TAKE 1 TABLET BY MOUTH EVERY DAY. Due for repeat labs. 90 tablet 0  . multivitamin (ONE-A-DAY MEN'S) TABS tablet Take 1 tablet by mouth daily.    . OIL OF OREGANO PO Take by mouth.     No current facility-administered medications on file prior to visit.    No Known Allergies  Family History  Problem Relation Age of Onset  . Healthy Mother   . Healthy Father   . Lupus Sister   . Thyroid disease Sister   . Lung cancer Maternal Grandfather        Smoker  . AAA (abdominal aortic aneurysm) Paternal Grandfather     Social History   Socioeconomic History  . Marital status: Married    Spouse name: Not on file  . Number of children: Not on file  . Years of education: Not on file  . Highest education level: Not on file  Occupational History  . Not on file  Tobacco Use  . Smoking status: Never Smoker  . Smokeless tobacco: Never Used  Vaping Use  . Vaping Use: Never used  Substance and  Sexual Activity  . Alcohol use: Yes    Comment: occasional  . Drug use: No  . Sexual activity: Yes  Other Topics Concern  . Not on file  Social History Narrative  . Not on file   Social Determinants of Health   Financial Resource Strain:   . Difficulty of Paying Living Expenses:   Food Insecurity:   . Worried About Programme researcher, broadcasting/film/video in the Last Year:   . Barista in the Last Year:   Transportation Needs:   . Freight forwarder (Medical):   Marland Kitchen Lack of Transportation (Non-Medical):   Physical Activity:   . Days of Exercise per Week:   . Minutes of Exercise per Session:   Stress:   . Feeling of Stress :   Social Connections:   . Frequency of Communication with Friends and Family:   . Frequency of Social Gatherings with Friends and Family:   . Attends Religious Services:   . Active Member of Clubs or Organizations:   . Attends Banker Meetings:   Marland Kitchen Marital Status:    Review of Systems - See HPI.  All other ROS are negative.  BP 110/70   Pulse (!) 54   Temp 98.4 F (36.9 C) (Temporal)   Resp 16   Ht 6' (1.829 m)  Wt 214 lb (97.1 kg)   SpO2 98%   BMI 29.02 kg/m   Physical Exam Vitals reviewed.  Constitutional:      Appearance: Normal appearance.  HENT:     Head: Normocephalic and atraumatic.     Right Ear: Tympanic membrane normal.     Left Ear: Tympanic membrane normal.  Cardiovascular:     Rate and Rhythm: Normal rate and regular rhythm.     Pulses: Normal pulses.     Heart sounds: Normal heart sounds.  Pulmonary:     Effort: Pulmonary effort is normal.     Breath sounds: Normal breath sounds.  Musculoskeletal:     Right shoulder: Normal.     Left shoulder: Normal.     Cervical back: Normal range of motion and neck supple. No rigidity, tenderness, bony tenderness or crepitus. No pain with movement.     Thoracic back: Normal.     Lumbar back: Normal.  Lymphadenopathy:     Cervical: No cervical adenopathy.  Neurological:      Mental Status: He is alert.  Psychiatric:        Mood and Affect: Mood normal.    Assessment/Plan: 1. Hypothyroidism, unspecified type Taking medication as directed. Overdue for TSH recheck. Will obtain labs today. - TSH  2. Chronic neck pain Many year history. Some symptoms seem related to TMJ and muscular tension/spasm. Is s/p imaging of cervical spine and multiple rounds of PT without improvement. Would like to image TMJ joints, especially R-sided and obtained CT neck.  - DG TMJ Open & Close Bilateral; Future - CT Soft Tissue Neck W Contrast; Future - CBC w/Diff  This visit occurred during the SARS-CoV-2 public health emergency.  Safety protocols were in place, including screening questions prior to the visit, additional usage of staff PPE, and extensive cleaning of exam room while observing appropriate contact time as indicated for disinfecting solutions.      Piedad Climes, PA-C

## 2020-06-24 ENCOUNTER — Other Ambulatory Visit: Payer: Self-pay | Admitting: Emergency Medicine

## 2020-06-24 DIAGNOSIS — E039 Hypothyroidism, unspecified: Secondary | ICD-10-CM

## 2020-06-24 MED ORDER — LEVOTHYROXINE SODIUM 125 MCG PO TABS: 125.0000 ug | ORAL_TABLET | Freq: Every day | ORAL | 1 refills | Status: DC

## 2020-06-29 ENCOUNTER — Ambulatory Visit: Payer: 59 | Admitting: Physician Assistant

## 2020-07-05 ENCOUNTER — Ambulatory Visit
Admission: RE | Admit: 2020-07-05 | Discharge: 2020-07-05 | Disposition: A | Discharge status: Home / Self Care | Payer: 59 | Source: Ambulatory Visit | Attending: Physician Assistant | Admitting: Physician Assistant

## 2020-07-05 ENCOUNTER — Other Ambulatory Visit: Payer: 59

## 2020-07-05 DIAGNOSIS — M542 Cervicalgia: Secondary | ICD-10-CM

## 2020-07-05 MED ORDER — IOPAMIDOL (ISOVUE-300) INJECTION 61%
75.0000 mL | Freq: Once | INTRAVENOUS | Status: AC | PRN
  Administered 2020-07-05: 75 mL via INTRAVENOUS

## 2020-07-07 ENCOUNTER — Encounter: Payer: Self-pay | Admitting: Emergency Medicine

## 2020-07-07 ENCOUNTER — Other Ambulatory Visit: Payer: Self-pay | Admitting: Emergency Medicine

## 2020-07-07 DIAGNOSIS — M2420 Disorder of ligament, unspecified site: Secondary | ICD-10-CM

## 2020-08-11 ENCOUNTER — Other Ambulatory Visit: Payer: Self-pay

## 2020-08-11 ENCOUNTER — Ambulatory Visit (INDEPENDENT_AMBULATORY_CARE_PROVIDER_SITE_OTHER): Payer: 59

## 2020-08-11 DIAGNOSIS — E039 Hypothyroidism, unspecified: Secondary | ICD-10-CM | POA: Diagnosis not present

## 2020-08-11 LAB — TSH: TSH: 6.96 u[IU]/mL — ABNORMAL HIGH (ref 0.35–4.50)

## 2020-08-12 ENCOUNTER — Telehealth: Payer: Self-pay | Admitting: Physician Assistant

## 2020-08-12 NOTE — Telephone Encounter (Signed)
Duplicate message. Patient's response added to lab result message.

## 2020-08-12 NOTE — Telephone Encounter (Signed)
Patient returned call from the lab - Patient states that he has only missed 2 days of medication due to being on a trip - please advise

## 2020-08-17 ENCOUNTER — Other Ambulatory Visit: Payer: Self-pay

## 2020-08-17 DIAGNOSIS — E039 Hypothyroidism, unspecified: Secondary | ICD-10-CM

## 2020-08-17 MED ORDER — LEVOTHYROXINE SODIUM 150 MCG PO TABS: 150.0000 ug | ORAL_TABLET | Freq: Every day | ORAL | 1 refills | Status: DC

## 2020-08-17 NOTE — Progress Notes (Signed)
t

## 2020-08-18 MED ORDER — TERBINAFINE HCL 250 MG PO TABS: 250.0000 mg | ORAL_TABLET | Freq: Every day | ORAL | 0 refills | Status: DC

## 2020-08-18 NOTE — Telephone Encounter (Signed)
I will sent in refill of the Lamisil tablets for him to start daily. Will likely take 3 months for resolution. Needs to have repeat LFTs at time of TSH recheck.

## 2020-09-01 ENCOUNTER — Other Ambulatory Visit: Payer: Self-pay | Admitting: Physician Assistant

## 2020-09-01 DIAGNOSIS — E039 Hypothyroidism, unspecified: Secondary | ICD-10-CM

## 2020-09-03 ENCOUNTER — Other Ambulatory Visit: Payer: 59

## 2020-09-21 ENCOUNTER — Other Ambulatory Visit: Payer: Self-pay

## 2020-09-21 ENCOUNTER — Ambulatory Visit: Payer: 59 | Admitting: Neurology

## 2020-09-21 ENCOUNTER — Encounter: Payer: Self-pay | Admitting: Neurology

## 2020-09-21 VITALS — BP 108/66 | HR 52 | Ht 72.0 in | Wt 214.5 lb

## 2020-09-21 DIAGNOSIS — M542 Cervicalgia: Secondary | ICD-10-CM

## 2020-09-21 MED ORDER — DULOXETINE HCL 60 MG PO CPEP: 60.0000 mg | ORAL_CAPSULE | Freq: Every day | ORAL | 3 refills | Status: DC

## 2020-09-21 NOTE — Progress Notes (Signed)
Chief Complaint  Patient presents with  . 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 Reports pain behind right ear that radiates up into head, along with numbness. The area feels swollen. His neck pops at times without movement. He also can get a sore, dry throat when all is triggered.   Marland Kitchen PCP    Brunetta Jeans, PA-C  . Neck Pain    HISTORICAL  Bobby Stewart is a 49 year old male, seen in request by his primary care PA Raiford Noble C for evaluation of right-sided neck discomfort, initial evaluation September 21, 2020   I reviewed and summarized the referring note.  Past medical history Hypothyroidism, on supplement,  He  complains of 5 years history of gradual onset right side of neck discomfort, he described it as a vibrating sensation at the right nuchal area, tender, feel inflamed upon deep palpitation, radiating discomfort to right chin, has the urge to open his mouth, to pop the joint, he also complains of swollen sensation at the right chin,  Oftentimes with small movement, he felt shooting discomfort from the right occipital region to right temporoparietal region, sharp transient,  Over the years, was seen by dentist, no evidence of TMJ,  Personally reviewed CT of cervical soft tissue in July 2021, extensive stylohyoid ligament ossification, otherwise negative  He was also seen by neurosurgeon, tried physical therapy, massage therapy without helping his symptoms, involving use cold compress, only temporarily relieved his symptoms   He denies gait abnormality, radiating pain to right arm, hand , REVIEW OF SYSTEMS: Full 14 system review of systems performed and notable only for as above All other review of systems were negative.  ALLERGIES: No Known Allergies  HOME MEDICATIONS: Current Outpatient Medications  Medication Sig Dispense Refill  . cyanocobalamin 500 MCG tablet Take 500 mcg by mouth daily.    . multivitamin (ONE-A-DAY  MEN'S) TABS tablet Take 1 tablet by mouth daily.    . OIL OF OREGANO PO Take by mouth.    . terbinafine (LAMISIL) 250 MG tablet Take 1 tablet (250 mg total) by mouth daily. 30 tablet 0  . VITAMIN D PO Take 1 tablet by mouth daily.    Marland Kitchen levothyroxine (SYNTHROID) 150 MCG tablet Take 1 tablet (150 mcg total) by mouth daily before breakfast. 30 tablet 1   No current facility-administered medications for this visit.    PAST MEDICAL HISTORY: Past Medical History:  Diagnosis Date  . Anxiety and depression   . History of chickenpox   . Hyperthyroidism     PAST SURGICAL HISTORY: Past Surgical History:  Procedure Laterality Date  . NO PAST SURGERIES      FAMILY HISTORY: Family History  Problem Relation Age of Onset  . Arthritis Mother        neck  . Healthy Father   . Thyroid disease Sister   . Lung cancer Maternal Grandfather        Smoker  . AAA (abdominal aortic aneurysm) Paternal Grandfather     SOCIAL HISTORY: Social History   Socioeconomic History  . Marital status: Married    Spouse name: Not on file  . Number of children: 1  . Years of education: 2.5 years of college  . Highest education level: Not on file  Occupational History  . Occupation: Oncologist  Tobacco Use  . Smoking status: Never Smoker  . Smokeless tobacco: Never Used  Vaping Use  . Vaping Use: Never used  Substance and  Sexual Activity  . Alcohol use: Yes    Comment: occasional  . Drug use: No  . Sexual activity: Yes  Other Topics Concern  . Not on file  Social History Narrative   Lives at home with his family.   Two cups caffeine per day.   Right-handed.   Social Determinants of Health   Financial Resource Strain:   . Difficulty of Paying Living Expenses: Not on file  Food Insecurity:   . Worried About Charity fundraiser in the Last Year: Not on file  . Ran Out of Food in the Last Year: Not on file  Transportation Needs:   . Lack of Transportation (Medical): Not on file  .  Lack of Transportation (Non-Medical): Not on file  Physical Activity:   . Days of Exercise per Week: Not on file  . Minutes of Exercise per Session: Not on file  Stress:   . Feeling of Stress : Not on file  Social Connections:   . Frequency of Communication with Friends and Family: Not on file  . Frequency of Social Gatherings with Friends and Family: Not on file  . Attends Religious Services: Not on file  . Active Member of Clubs or Organizations: Not on file  . Attends Archivist Meetings: Not on file  . Marital Status: Not on file  Intimate Partner Violence:   . Fear of Current or Ex-Partner: Not on file  . Emotionally Abused: Not on file  . Physically Abused: Not on file  . Sexually Abused: Not on file     PHYSICAL EXAM   Vitals:   09/21/20 1534  BP: 108/66  Pulse: (!) 52  Weight: 214 lb 8 oz (97.3 kg)  Height: 6' (1.829 m)   Not recorded     Body mass index is 29.09 kg/m.  PHYSICAL EXAMNIATION:  Gen: NAD, conversant, well nourised, well groomed                     Cardiovascular: Regular rate rhythm, no peripheral edema, warm, nontender. Eyes: Conjunctivae clear without exudates or hemorrhage Neck: Supple, no carotid bruits. Pulmonary: Clear to auscultation bilaterally   NEUROLOGICAL EXAM:  MENTAL STATUS: Speech:    Speech is normal; fluent and spontaneous with normal comprehension.  Cognition:     Orientation to time, place and person     Normal recent and remote memory     Normal Attention span and concentration     Normal Language, naming, repeating,spontaneous speech     Fund of knowledge   CRANIAL NERVES: CN II: Visual fields are full to confrontation. Pupils are round equal and briskly reactive to light. CN III, IV, VI: extraocular movement are normal. No ptosis. CN V: Facial sensation is intact to light touch CN VII: Face is symmetric with normal eye closure  CN VIII: Hearing is normal to causal conversation. CN IX, X: Phonation is  normal. CN XI: Head turning and shoulder shrug are intact  MOTOR: There is no pronator drift of out-stretched arms. Muscle bulk and tone are normal. Muscle strength is normal.  REFLEXES: Reflexes are 2+ and symmetric at the biceps, triceps, knees, and ankles. Plantar responses are flexor.  SENSORY: Intact to light touch, pinprick and vibratory sensation are intact in fingers and toes.  COORDINATION: There is no trunk or limb dysmetria noted.  GAIT/STANCE: Posture is normal. Gait is steady with normal steps, base, arm swing, and turning. Heel and toe walking are normal. Tandem gait is  normal.  Romberg is absent.   DIAGNOSTIC DATA (LABS, IMAGING, TESTING) - I reviewed patient records, labs, notes, testing and imaging myself where available.   ASSESSMENT AND PLAN  Bobby Stewart is a 49 y.o. male   Right neck discomfort  Normal neurological examination,  Nonlocalized,  He desires extensive evaluation, proceed to MRI of cervical spine  ESR, C-reactive protein  Hot compression, massage therapy    Marcial Pacas, M.D. Ph.D.  [facility] [address], [place], [place] [address] Ph: [phone] Fax: [phone]  CC:  Brunetta Jeans, PA-C [address] [place],   [address]

## 2020-09-22 ENCOUNTER — Telehealth: Payer: Self-pay | Admitting: Neurology

## 2020-09-22 LAB — C-REACTIVE PROTEIN: CRP: 2 mg/L (ref 0–10)

## 2020-09-22 LAB — SEDIMENTATION RATE: Sed Rate: 2 mm/hr (ref 0–15)

## 2020-09-22 NOTE — Telephone Encounter (Signed)
Patient returned my call he is scheduled at [facility] For 09/28/20.

## 2020-09-22 NOTE — Telephone Encounter (Signed)
LVM for pt to call back about scheduling [facility] Auth: [id number] (exp. 09/23/11 to 11/06/20)

## 2020-09-28 ENCOUNTER — Ambulatory Visit: Payer: 59

## 2020-09-28 ENCOUNTER — Other Ambulatory Visit: Payer: Self-pay

## 2020-09-28 DIAGNOSIS — M542 Cervicalgia: Secondary | ICD-10-CM | POA: Diagnosis not present

## 2020-09-30 ENCOUNTER — Encounter: Payer: Self-pay | Admitting: Physician Assistant

## 2020-10-05 NOTE — Telephone Encounter (Signed)
He needs to discuss with the provider who ordered the imaging and is doing the workup first.

## 2020-10-12 ENCOUNTER — Other Ambulatory Visit: Payer: Self-pay | Admitting: Physician Assistant

## 2020-10-12 DIAGNOSIS — E039 Hypothyroidism, unspecified: Secondary | ICD-10-CM

## 2020-10-12 NOTE — Telephone Encounter (Signed)
Yet again overdue for TSH recheck. Please call pt to get him scheduled for lab visit. Ok to give 104-month refill.

## 2020-10-13 ENCOUNTER — Encounter: Payer: Self-pay | Admitting: Emergency Medicine

## 2020-11-24 ENCOUNTER — Other Ambulatory Visit: Payer: Self-pay | Admitting: Physician Assistant

## 2020-11-24 DIAGNOSIS — E039 Hypothyroidism, unspecified: Secondary | ICD-10-CM

## 2020-12-01 ENCOUNTER — Emergency Department (HOSPITAL_BASED_OUTPATIENT_CLINIC_OR_DEPARTMENT_OTHER): Payer: 59

## 2020-12-01 ENCOUNTER — Encounter (HOSPITAL_BASED_OUTPATIENT_CLINIC_OR_DEPARTMENT_OTHER): Payer: Self-pay

## 2020-12-01 ENCOUNTER — Inpatient Hospital Stay (HOSPITAL_BASED_OUTPATIENT_CLINIC_OR_DEPARTMENT_OTHER)
Admission: EM | Admit: 2020-12-01 | Discharge: 2020-12-03 | DRG: 389 | Disposition: A | Discharge status: Home / Self Care | Payer: 59 | Attending: Internal Medicine | Admitting: Internal Medicine

## 2020-12-01 ENCOUNTER — Other Ambulatory Visit: Payer: Self-pay

## 2020-12-01 DIAGNOSIS — F32A Depression, unspecified: Secondary | ICD-10-CM | POA: Diagnosis present

## 2020-12-01 DIAGNOSIS — K56609 Unspecified intestinal obstruction, unspecified as to partial versus complete obstruction: Principal | ICD-10-CM | POA: Diagnosis present

## 2020-12-01 DIAGNOSIS — Z801 Family history of malignant neoplasm of trachea, bronchus and lung: Secondary | ICD-10-CM | POA: Diagnosis not present

## 2020-12-01 DIAGNOSIS — F419 Anxiety disorder, unspecified: Secondary | ICD-10-CM | POA: Diagnosis present

## 2020-12-01 DIAGNOSIS — E871 Hypo-osmolality and hyponatremia: Secondary | ICD-10-CM | POA: Diagnosis not present

## 2020-12-01 DIAGNOSIS — Z79890 Hormone replacement therapy (postmenopausal): Secondary | ICD-10-CM | POA: Diagnosis not present

## 2020-12-01 DIAGNOSIS — Z20822 Contact with and (suspected) exposure to covid-19: Secondary | ICD-10-CM | POA: Diagnosis not present

## 2020-12-01 DIAGNOSIS — Z8261 Family history of arthritis: Secondary | ICD-10-CM | POA: Diagnosis not present

## 2020-12-01 DIAGNOSIS — Z8349 Family history of other endocrine, nutritional and metabolic diseases: Secondary | ICD-10-CM | POA: Diagnosis not present

## 2020-12-01 DIAGNOSIS — Z0189 Encounter for other specified special examinations: Secondary | ICD-10-CM

## 2020-12-01 DIAGNOSIS — E039 Hypothyroidism, unspecified: Secondary | ICD-10-CM | POA: Diagnosis not present

## 2020-12-01 DIAGNOSIS — Z79899 Other long term (current) drug therapy: Secondary | ICD-10-CM | POA: Diagnosis not present

## 2020-12-01 DIAGNOSIS — R109 Unspecified abdominal pain: Secondary | ICD-10-CM | POA: Diagnosis present

## 2020-12-01 LAB — RESP PANEL BY RT-PCR (FLU A&B, COVID) ARPGX2
Influenza A by PCR: NEGATIVE
Influenza B by PCR: NEGATIVE
SARS Coronavirus 2 by RT PCR: NEGATIVE

## 2020-12-01 LAB — CBC WITH DIFFERENTIAL/PLATELET
Abs Immature Granulocytes: 0.03 10*3/uL (ref 0.00–0.07)
Basophils Absolute: 0 10*3/uL (ref 0.0–0.1)
Basophils Relative: 0 %
Eosinophils Absolute: 0.1 10*3/uL (ref 0.0–0.5)
Eosinophils Relative: 2 %
HCT: 42.7 % (ref 39.0–52.0)
Hemoglobin: 15 g/dL (ref 13.0–17.0)
Immature Granulocytes: 1 %
Lymphocytes Relative: 24 %
Lymphs Abs: 1.5 10*3/uL (ref 0.7–4.0)
MCH: 29.1 pg (ref 26.0–34.0)
MCHC: 35.1 g/dL (ref 30.0–36.0)
MCV: 82.8 fL (ref 80.0–100.0)
Monocytes Absolute: 0.6 10*3/uL (ref 0.1–1.0)
Monocytes Relative: 9 %
Neutro Abs: 4.1 10*3/uL (ref 1.7–7.7)
Neutrophils Relative %: 64 %
Platelets: 174 10*3/uL (ref 150–400)
RBC: 5.16 MIL/uL (ref 4.22–5.81)
RDW: 11.4 % — ABNORMAL LOW (ref 11.5–15.5)
WBC: 6.3 10*3/uL (ref 4.0–10.5)
nRBC: 0 % (ref 0.0–0.2)

## 2020-12-01 LAB — COMPREHENSIVE METABOLIC PANEL
ALT: 35 U/L (ref 0–44)
AST: 28 U/L (ref 15–41)
Albumin: 4.1 g/dL (ref 3.5–5.0)
Alkaline Phosphatase: 51 U/L (ref 38–126)
Anion gap: 10 (ref 5–15)
BUN: 18 mg/dL (ref 6–20)
CO2: 26 mmol/L (ref 22–32)
Calcium: 8.7 mg/dL — ABNORMAL LOW (ref 8.9–10.3)
Chloride: 97 mmol/L — ABNORMAL LOW (ref 98–111)
Creatinine, Ser: 1.28 mg/dL — ABNORMAL HIGH (ref 0.61–1.24)
GFR, Estimated: >60 mL/min (ref >60)
Glucose, Bld: 95 mg/dL (ref 70–99)
Potassium: 3.7 mmol/L (ref 3.5–5.1)
Sodium: 133 mmol/L — ABNORMAL LOW (ref 135–145)
Total Bilirubin: 1.3 mg/dL — ABNORMAL HIGH (ref 0.3–1.2)
Total Protein: 7 g/dL (ref 6.5–8.1)

## 2020-12-01 LAB — LIPASE, BLOOD: Lipase: 19 U/L (ref 11–51)

## 2020-12-01 MED ORDER — ACETAMINOPHEN 325 MG PO TABS: 650.0000 mg | ORAL_TABLET | Freq: Four times a day (QID) | ORAL | Status: DC | PRN

## 2020-12-01 MED ORDER — PHENOL 1.4 % MT LIQD
1.0000 | OROMUCOSAL | Status: DC | PRN
  Filled 2020-12-01: qty 177

## 2020-12-01 MED ORDER — MORPHINE SULFATE (PF) 2 MG/ML IV SOLN
2.0000 mg | INTRAVENOUS | Status: DC | PRN
  Administered 2020-12-01 (×2): 2 mg via INTRAVENOUS
  Filled 2020-12-01 (×2): qty 1

## 2020-12-01 MED ORDER — MIDAZOLAM HCL 2 MG/2ML IJ SOLN
2.0000 mg | Freq: Once | INTRAMUSCULAR | Status: AC
  Administered 2020-12-01: 13:00:00 2 mg via INTRAVENOUS
  Filled 2020-12-01: qty 2

## 2020-12-01 MED ORDER — ONDANSETRON HCL 4 MG/2ML IJ SOLN: 4.0000 mg | Freq: Four times a day (QID) | INTRAMUSCULAR | Status: DC | PRN

## 2020-12-01 MED ORDER — ACETAMINOPHEN 650 MG RE SUPP: 650.0000 mg | Freq: Four times a day (QID) | RECTAL | Status: DC | PRN

## 2020-12-01 MED ORDER — SODIUM CHLORIDE 0.9 % IV BOLUS
1000.0000 mL | Freq: Once | INTRAVENOUS | Status: AC
  Administered 2020-12-01: 10:00:00 1000 mL via INTRAVENOUS

## 2020-12-01 MED ORDER — MENTHOL 3 MG MT LOZG: 1.0000 | LOZENGE | OROMUCOSAL | Status: DC | PRN

## 2020-12-01 MED ORDER — SODIUM CHLORIDE 0.9 % IV SOLN: INTRAVENOUS | Status: DC

## 2020-12-01 MED ORDER — IOHEXOL 300 MG/ML  SOLN
100.0000 mL | Freq: Once | INTRAMUSCULAR | Status: AC | PRN
  Administered 2020-12-01: 100 mL via INTRAVENOUS

## 2020-12-01 MED ORDER — KETOROLAC TROMETHAMINE 15 MG/ML IJ SOLN
15.0000 mg | Freq: Once | INTRAMUSCULAR | Status: AC
  Administered 2020-12-01: 10:00:00 15 mg via INTRAVENOUS
  Filled 2020-12-01: qty 1

## 2020-12-01 NOTE — ED Notes (Signed)
Security to go and get Advertising account planner from his car for pt

## 2020-12-01 NOTE — Consult Note (Signed)
[facility] Surgery Consult Note  Bobby Stewart 05/06/1971  [id number].    Requesting MD: Norman Clay Chief Complaint:  Generalized abdominal pain, diarrhea, with nausea since 11/27/20 Reason for Consult: SBO  HPI:  Pt is a 49 y/o male who presents to [facility] with the above noted complaints.  He has a hx of hypothyroidism, anxiety and depression. He told the ED symptoms started on Saturday with diarrhea, that lasted for 4 days,  The diarrhea stopped and he became distended and constipated yesterday.  He noticed the distension on 11/29/20, but didn't really become bad till yesterday. He cannot get comfortable in any position right now.  No prior hx of abdominal surgery.  No hx of IBS, he has had some abdominal discomfort in the past, but not often, he has looked up Crohn's disease, but there is no family hx of it either  Work up shows he is afebrile, BP up some with admit but better now.  Creatinine is 1.28, Na 133, T Bil 1.3,  WBC 6.3, H/H 15/42.7, platelets 174K. Respiratory panel is pending.   CT scan:  Shows dilated proximal and decompressed distal small bowel loops; transition point appears to be mid right lateral abdomen.  Stomach is unremarkable, he has some diverticulosis without diverticulitis.  We are ask to see.  I recommended Medicine admit, and we will see in consult.      ROS: Review of Systems  Constitutional: Positive for weight loss (Not sure,but most likely has lost some since Saturday 12/18).  HENT: Negative.   Eyes: Negative.   Respiratory: Negative.   Cardiovascular: Negative.   Gastrointestinal: Positive for abdominal pain (worse over the last 2 days), blood in stool (NOt sure different color), constipation (12/21), diarrhea (12/18-12/21), heartburn (occasional) and nausea. Negative for melena and vomiting.  Genitourinary: Negative.   Musculoskeletal: Positive for neck pain (chronic neck pain and followed by multiple MD's).  Skin: Negative.   Neurological:  Negative.   Endo/Heme/Allergies: Negative.   Psychiatric/Behavioral: Negative.        He denies hx of anxiety/depression, does not know what Cymbalta is.     Family History  Problem Relation Age of Onset  . Arthritis Mother        neck  . Healthy Father   . Thyroid disease Sister   . Lung cancer Maternal Grandfather        Smoker  . AAA (abdominal aortic aneurysm) Paternal Grandfather     Past Medical History:  Diagnosis Date  . Anxiety and depression   . History of chickenpox   . Hyperthyroidism     Past Surgical History:  Procedure Laterality Date  . NO PAST SURGERIES      Social History:  reports that he has never smoked. He has never used smokeless tobacco. He reports previous alcohol use. He reports that he does not use drugs.  Drugs: None ETOH:None Tobacco:none Married/1 child 2 pending in July  Allergies: No Known Allergies  Prior to Admission medications   Medication Sig Start Date End Date Taking? Authorizing Provider  cyanocobalamin 500 MCG tablet Take 500 mcg by mouth daily.    [provider]  DULoxetine (CYMBALTA) 60 MG capsule Take 1 capsule (60 mg total) by mouth daily. 09/21/20 Says he Is not taking this  Levert Feinstein, MD  levothyroxine (SYNTHROID) 150 MCG tablet Take 1 tablet (150 mcg total) by mouth daily before breakfast. Schedule a lab appointment for refills 10/13/20   Waldon Merl, PA-C  multivitamin (ONE-A-DAY MEN'S) TABS  tablet Take 1 tablet by mouth daily.    [provider]  OIL OF OREGANO PO Take by mouth.    [provider]  terbinafine (LAMISIL) 250 MG tablet Take 1 tablet (250 mg total) by mouth daily. 08/18/20   Waldon Merl, PA-C  VITAMIN D PO Take 1 tablet by mouth daily.    [provider]     Blood pressure 125/85, pulse (!) 54, temperature 97.7 F (36.5 C), temperature source Oral, resp. rate 16, height 6' (1.829 m), weight 97.5 kg, SpO2 100 %. Physical Exam:  General: ill distended WD,  WN white male who is laying in bed and is very miserable with abdominal distension. HEENT: head is normocephalic, atraumatic.  Sclera are noninjected.  Pupils are equal.   Ears and nose without any masses or lesions.  Mouth is pink and moist Heart: regular, rate, and rhythm.  Normal s1,s2. No obvious murmurs, gallops, or rubs noted.  Palpable radial and pedal pulses bilaterally Lungs: CTAB, no wheezes, rhonchi, or rales noted.  Respiratory effort nonlabored Abd: distended and tender to any palpation, + high pitched hyperactive BS, no masses, hernias, or organomegaly.  No scars. MS: all 4 extremities are symmetrical with no cyanosis, clubbing, or edema. Skin: warm and dry with no masses, lesions, or rashes Neuro: Cranial nerves 2-12 grossly intact, sensation is normal throughout Psych: A&Ox3 with an appropriate affect.   Results for orders placed or performed during the hospital encounter of 12/01/20 (from the past 48 hour(s))  Comprehensive metabolic panel     Status: Abnormal   Collection Time: 12/01/20 10:12 AM  Result Value Ref Range   Sodium 133 (L) 135 - 145 mmol/L   Potassium 3.7 3.5 - 5.1 mmol/L   Chloride 97 (L) 98 - 111 mmol/L   CO2 26 22 - 32 mmol/L   Glucose, Bld 95 70 - 99 mg/dL    Comment: Glucose reference range applies only to samples taken after fasting for at least 8 hours.   BUN 18 6 - 20 mg/dL   Creatinine, Ser 3.57 (H) 0.61 - 1.24 mg/dL   Calcium 8.7 (L) 8.9 - 10.3 mg/dL   Total Protein 7.0 6.5 - 8.1 g/dL   Albumin 4.1 3.5 - 5.0 g/dL   AST 28 15 - 41 U/L   ALT 35 0 - 44 U/L   Alkaline Phosphatase 51 38 - 126 U/L   Total Bilirubin 1.3 (H) 0.3 - 1.2 mg/dL   GFR, Estimated >01 >77 mL/min    Comment: (NOTE) Calculated using the CKD-EPI Creatinine Equation (2021)    Anion gap 10 5 - 15    Comment: Performed at [facility], [address] [place], [place] [address]  CBC with Differential     Status: Abnormal   Collection Time: 12/01/20 10:12 AM  Result  Value Ref Range   WBC 6.3 4.0 - 10.5 K/uL   RBC 5.16 4.22 - 5.81 MIL/uL   Hemoglobin 15.0 13.0 - 17.0 g/dL   HCT 00.9 23.3 - 00.7 %   MCV 82.8 80.0 - 100.0 fL   MCH 29.1 26.0 - 34.0 pg   MCHC 35.1 30.0 - 36.0 g/dL   RDW 62.2 (L) 63.3 - 35.4 %   Platelets 174 150 - 400 K/uL   nRBC 0.0 0.0 - 0.2 %   Neutrophils Relative % 64 %   Neutro Abs 4.1 1.7 - 7.7 K/uL   Lymphocytes Relative 24 %   Lymphs Abs 1.5 0.7 -  4.0 K/uL   Monocytes Relative 9 %   Monocytes Absolute 0.6 0.1 - 1.0 K/uL   Eosinophils Relative 2 %   Eosinophils Absolute 0.1 0.0 - 0.5 K/uL   Basophils Relative 0 %   Basophils Absolute 0.0 0.0 - 0.1 K/uL   Immature Granulocytes 1 %   Abs Immature Granulocytes 0.03 0.00 - 0.07 K/uL    Comment: Performed at [facility], [address] [place], [place] [address]  Lipase, blood     Status: None   Collection Time: 12/01/20 10:12 AM  Result Value Ref Range   Lipase 19 11 - 51 U/L    Comment: Performed at [facility], [address] [place], [place] [address]   CT Abdomen Pelvis W Contrast  Result Date: 12/01/2020 CLINICAL DATA:  Acute abdominal pain and distension, generalized abdominal pain with tenderness, diarrhea and nausea since Saturday EXAM: CT ABDOMEN AND PELVIS WITH CONTRAST TECHNIQUE: Multidetector CT imaging of the abdomen and pelvis was performed using the standard protocol following bolus administration of intravenous contrast. Sagittal and coronal MPR images reconstructed from axial data set. CONTRAST:  OMNIPAQUE IOHEXOL 300 MG/ML SOLN IV. Dilute oral contrast. COMPARISON:  None FINDINGS: Lower chest: Lung bases clear Hepatobiliary: Gallbladder and liver normal appearance Pancreas: Normal appearance Spleen: Normal appearance Adrenals/Urinary Tract: Adrenal glands, kidneys, ureters, and bladder normal appearance Stomach/Bowel: Appendix not visualized, no pericecal inflammatory process seen. Mild diverticulosis of descending and transverse  colon without evidence of diverticulitis. Dilated proximal and decompressed distal small bowel loops consistent month small-bowel obstruction. Transition point appears to be in the distal small bowel in the RIGHT mid abdomen laterally. Stomach unremarkable. Vascular/Lymphatic: Few pelvic phleboliths. Aorta normal caliber. Vascular structures patent. Scattered normal size mesenteric lymph nodes. No adenopathy. Reproductive: Unremarkable prostate gland and seminal vesicles Other: Small amount of free fluid.  No free air.  No hernia. Musculoskeletal: Unremarkable IMPRESSION: Small bowel obstruction with transition in distal small bowel in RIGHT mid abdomen laterally. Small amount of nonspecific free fluid. Distal colonic diverticulosis without evidence of diverticulitis. Electronically Signed   By: Ulyses Southward M.D.   On: 12/01/2020 12:36      Assessment/Plan Hypothyroid AKI Chronic neck pain Hx of costochondritis  SBO - no surgical hx Possible gastroenteritis  FEN:  NPO x ice chips/sips/IV fluids ID:  None DVT:  Per Hospitalist Follow up:  TBD  Plan:  He has a good deal of contrast in his small bowel so we will recheck film in AM.  Recommend IV fluids, NPO except ice chips, sips for oral comfort.  I irrigated the NG and got it working.  Recheck labs and film in AM.  Conservative management for now.  Consider SB protocol tomorrow if current contrast does not give [place] enough information.  I gave him some morphine and IV fluids when I got there because he was so miserable.     Sherrie George [facility] 12/01/2020, 1:08 PM Please see Amion for pager number during day hours 7:00am-4:30pm

## 2020-12-01 NOTE — ED Triage Notes (Signed)
Pt arrives complaining of generalized abdominal pain, tender to touch, with diarrhea and nausea since Saturday. States swelling to abdomen, unable to bend down to tie shoes.

## 2020-12-01 NOTE — H&P (Addendum)
History and Physical    Bobby Stewart VZD:[id number] DOB: [birth year] DOA: 12/01/2020  PCP: Waldon Merl, PA-C  Patient coming from: [facility]  Chief Complaint: abdominal pain.  HPI: Bobby Stewart is a 49 y.o. male with medical history significant of hypothyroidism. Presenting with abdominal pain. He reports that he had some diarrhea starting about 6 days ago. He thought it was food poisoning. It continued for 3 days and finally abated. 3 days ago he started feeling nauseous, so he started taking pepto bismol. He noticed he PO intake becoming worse. Yesterday he started feeling a swelling in his stomach and diffuse pain. Pepto was no longer helping. He decided to come to the ED. He reports no other aggravating or alleviating factors. He denies any other treatments.    ED Course: Abdominal imaging shows SBO. EDP spoke with surgery team. They recommended non-surgical intervention. NGT was placed. TRH was called for admission.   Review of Systems:  Denies V, CP, dyspnea. Review of systems is otherwise negative for all not mentioned in HPI.   PMHx Past Medical History:  Diagnosis Date  . Anxiety and depression   . History of chickenpox   . Hyperthyroidism     PSHx Past Surgical History:  Procedure Laterality Date  . NO PAST SURGERIES      SocHx  reports that he has never smoked. He has never used smokeless tobacco. He reports previous alcohol use. He reports that he does not use drugs.  No Known Allergies  FamHx Family History  Problem Relation Age of Onset  . Arthritis Mother        neck  . Healthy Father   . Thyroid disease Sister   . Lung cancer Maternal Grandfather        Smoker  . AAA (abdominal aortic aneurysm) Paternal Grandfather     Prior to Admission medications   Medication Sig Start Date End Date Taking? Authorizing Provider  cyanocobalamin 500 MCG tablet Take 500 mcg by mouth daily.    [provider]  DULoxetine (CYMBALTA) 60 MG  capsule Take 1 capsule (60 mg total) by mouth daily. 09/21/20   Levert Feinstein, MD  levothyroxine (SYNTHROID) 150 MCG tablet Take 1 tablet (150 mcg total) by mouth daily before breakfast. Schedule a lab appointment for refills 10/13/20   Waldon Merl, PA-C  multivitamin (ONE-A-DAY MEN'S) TABS tablet Take 1 tablet by mouth daily.    [provider]  OIL OF OREGANO PO Take by mouth.    [provider]  terbinafine (LAMISIL) 250 MG tablet Take 1 tablet (250 mg total) by mouth daily. 08/18/20   Waldon Merl, PA-C  VITAMIN D PO Take 1 tablet by mouth daily.    [provider]    Physical Exam: Vitals:   12/01/20 1110 12/01/20 1239 12/01/20 1345 12/01/20 1534  BP: (!) 122/93 125/85 (!) 123/92 132/86  Pulse: (!) 58 (!) 54 71 (!) 59  Resp: 16 16  16   Temp:    98 F (36.7 C)  TempSrc:    Oral  SpO2: 100% 100% 99% 100%  Weight:      Height:        General: 49 y.o. male resting in bed in NAD Eyes: PERRL, normal sclera ENMT: Nares patent w/o discharge, orophaynx clear, dentition normal, ears w/o discharge/lesions/ulcers Neck: Supple, trachea midline Cardiovascular: RRR, +S1, S2, no m/g/r, equal pulses throughout Respiratory: CTABL, no w/r/r, normal WOB GI: BS+, distended, but soft, TTP to RUQ/LUQ, no masses noted, no  organomegaly noted MSK: No e/c/c Skin: No rashes, bruises, ulcerations noted Neuro: A&O x 3, no focal deficits Psyc: Appropriate interaction and affect, calm/cooperative  Labs on Admission: I have personally reviewed following labs and imaging studies  CBC: Recent Labs  Lab 12/01/20 1012  WBC 6.3  NEUTROABS 4.1  HGB 15.0  HCT 42.7  MCV 82.8  PLT 174   Basic Metabolic Panel: Recent Labs  Lab 12/01/20 1012  NA 133*  K 3.7  CL 97*  CO2 26  GLUCOSE 95  BUN 18  CREATININE 1.28*  CALCIUM 8.7*   GFR: Estimated Creatinine Clearance: 86.4 mL/min (A) (by C-G formula based on SCr of 1.28 mg/dL (H)). Liver Function Tests: Recent Labs   Lab 12/01/20 1012  AST 28  ALT 35  ALKPHOS 51  BILITOT 1.3*  PROT 7.0  ALBUMIN 4.1   Recent Labs  Lab 12/01/20 1012  LIPASE 19   No results for input(s): AMMONIA in the last 168 hours. Coagulation Profile: No results for input(s): INR, PROTIME in the last 168 hours. Cardiac Enzymes: No results for input(s): CKTOTAL, CKMB, CKMBINDEX, TROPONINI in the last 168 hours. BNP (last 3 results) No results for input(s): PROBNP in the last 8760 hours. HbA1C: No results for input(s): HGBA1C in the last 72 hours. CBG: No results for input(s): GLUCAP in the last 168 hours. Lipid Profile: No results for input(s): CHOL, HDL, LDLCALC, TRIG, CHOLHDL, LDLDIRECT in the last 72 hours. Thyroid Function Tests: No results for input(s): TSH, T4TOTAL, FREET4, T3FREE, THYROIDAB in the last 72 hours. Anemia Panel: No results for input(s): VITAMINB12, FOLATE, FERRITIN, TIBC, IRON, RETICCTPCT in the last 72 hours. Urine analysis: No results found for: COLORURINE, APPEARANCEUR, LABSPEC, PHURINE, GLUCOSEU, HGBUR, BILIRUBINUR, KETONESUR, PROTEINUR, UROBILINOGEN, NITRITE, LEUKOCYTESUR  Radiological Exams on Admission: CT Abdomen Pelvis W Contrast  Result Date: 12/01/2020 CLINICAL DATA:  Acute abdominal pain and distension, generalized abdominal pain with tenderness, diarrhea and nausea since Saturday EXAM: CT ABDOMEN AND PELVIS WITH CONTRAST TECHNIQUE: Multidetector CT imaging of the abdomen and pelvis was performed using the standard protocol following bolus administration of intravenous contrast. Sagittal and coronal MPR images reconstructed from axial data set. CONTRAST:  OMNIPAQUE IOHEXOL 300 MG/ML SOLN IV. Dilute oral contrast. COMPARISON:  None FINDINGS: Lower chest: Lung bases clear Hepatobiliary: Gallbladder and liver normal appearance Pancreas: Normal appearance Spleen: Normal appearance Adrenals/Urinary Tract: Adrenal glands, kidneys, ureters, and bladder normal appearance Stomach/Bowel:  Appendix not visualized, no pericecal inflammatory process seen. Mild diverticulosis of descending and transverse colon without evidence of diverticulitis. Dilated proximal and decompressed distal small bowel loops consistent month small-bowel obstruction. Transition point appears to be in the distal small bowel in the RIGHT mid abdomen laterally. Stomach unremarkable. Vascular/Lymphatic: Few pelvic phleboliths. Aorta normal caliber. Vascular structures patent. Scattered normal size mesenteric lymph nodes. No adenopathy. Reproductive: Unremarkable prostate gland and seminal vesicles Other: Small amount of free fluid.  No free air.  No hernia. Musculoskeletal: Unremarkable IMPRESSION: Small bowel obstruction with transition in distal small bowel in RIGHT mid abdomen laterally. Small amount of nonspecific free fluid. Distal colonic diverticulosis without evidence of diverticulitis. Electronically Signed   By: Ulyses Southward M.D.   On: 12/01/2020 12:36   DG Chest Port 1 View  Result Date: 12/01/2020 CLINICAL DATA:  Nasogastric tube placement EXAM: PORTABLE CHEST 1 VIEW COMPARISON:  CT abdomen and pelvis December 01, 2020 FINDINGS: Nasogastric tube tip and side port are in the stomach. There is no edema or airspace opacity. Heart is upper normal  in size with pulmonary vascularity normal. Visualized bowel gas pattern unremarkable. IMPRESSION: Nasogastric tube tip and side port in stomach. No edema or airspace opacity. Heart upper normal in size. Electronically Signed   By: Bretta Bang III M.D.   On: 12/01/2020 14:06   Assessment/Plan SBO     - place in obs, med-surg     - NGT to LSW     - bowel rest     - fluids, pain control  Hyponatremia     - fluids, follow  Hypothyroidism     - continue levothyroxine once he is able to take PO; if unable to start tomorrow, will consider IV dosing starting next day  DVT prophylaxis: lovenox  Code Status: FULL  Family Communication: None at bedside.   Consults  called: General surgery   Status is: Observation  The patient remains OBS appropriate and will d/c before 2 midnights.  Dispo: The patient is from: Home              Anticipated d/c is to: Home              Anticipated d/c date is: 1 day              Patient currently is not medically stable to d/c.  Teddy Spike DO [facility]  If 7PM-7AM, please contact night-coverage [email or web address]  12/01/2020, 3:51 PM

## 2020-12-01 NOTE — ED Provider Notes (Signed)
[facility] EMERGENCY DEPARTMENT Provider Note   CSN: [id number] Arrival date & time: 12/01/20  0940     History Chief Complaint  Patient presents with  . Abdominal Pain    Bobby Stewart is a 49 y.o. male.  Patient presents to ER chief complaint of abdominal distention and pain.  He states he been having diarrhea for about 3 to 4 days but last episode of diarrhea was 2 days ago.  Since then he has been feeling like he cannot have a bowel movement.  He is noticed increasing abdominal swelling and pain since then.  No vomiting.  No fevers.  No cough.  No recent antibiotics or recent travel.        Past Medical History:  Diagnosis Date  . Anxiety and depression   . History of chickenpox   . Hyperthyroidism     Patient Active Problem List   Diagnosis Date Noted  . Neck pain on right side 09/21/2020  . Hypothyroidism 06/23/2020  . Xiphoid pain 09/17/2012    Past Surgical History:  Procedure Laterality Date  . NO PAST SURGERIES         Family History  Problem Relation Age of Onset  . Arthritis Mother        neck  . Healthy Father   . Thyroid disease Sister   . Lung cancer Maternal Grandfather        Smoker  . AAA (abdominal aortic aneurysm) Paternal Grandfather     Social History   Tobacco Use  . Smoking status: Never Smoker  . Smokeless tobacco: Never Used  Vaping Use  . Vaping Use: Never used  Substance Use Topics  . Alcohol use: Not Currently    Comment: occasional  . Drug use: No    Home Medications Prior to Admission medications   Medication Sig Start Date End Date Taking? Authorizing Provider  cyanocobalamin 500 MCG tablet Take 500 mcg by mouth daily.    [provider]  DULoxetine (CYMBALTA) 60 MG capsule Take 1 capsule (60 mg total) by mouth daily. 09/21/20   Levert Feinstein, MD  levothyroxine (SYNTHROID) 150 MCG tablet Take 1 tablet (150 mcg total) by mouth daily before breakfast. Schedule a lab appointment for refills  10/13/20   Waldon Merl, PA-C  multivitamin (ONE-A-DAY MEN'S) TABS tablet Take 1 tablet by mouth daily.    [provider]  OIL OF OREGANO PO Take by mouth.    [provider]  terbinafine (LAMISIL) 250 MG tablet Take 1 tablet (250 mg total) by mouth daily. 08/18/20   Waldon Merl, PA-C  VITAMIN D PO Take 1 tablet by mouth daily.    [provider]    Allergies    Patient has no known allergies.  Review of Systems   Review of Systems  Constitutional: Negative for fever.  HENT: Negative for ear pain and sore throat.   Eyes: Negative for pain.  Respiratory: Negative for cough.   Cardiovascular: Negative for chest pain.  Gastrointestinal: Positive for abdominal distention and abdominal pain.  Genitourinary: Negative for flank pain.  Musculoskeletal: Negative for back pain.  Skin: Negative for color change and rash.  Neurological: Negative for syncope.  All other systems reviewed and are negative.   Physical Exam Updated Vital Signs BP 125/85 (BP Location: Right Arm)   Pulse (!) 54   Temp 97.7 F (36.5 C) (Oral)   Resp 16   Ht 6' (1.829 m)   Wt 97.5 kg  SpO2 100%   BMI 29.16 kg/m   Physical Exam Constitutional:      General: He is not in acute distress.    Appearance: He is well-developed.  HENT:     Head: Normocephalic.     Mouth/Throat:     Mouth: Mucous membranes are moist.  Cardiovascular:     Rate and Rhythm: Normal rate.  Pulmonary:     Effort: Pulmonary effort is normal.  Abdominal:     Palpations: Abdomen is soft.     Tenderness: There is abdominal tenderness.     Comments: Tenderness palpation in epigastric region.  Musculoskeletal:     Right lower leg: No edema.     Left lower leg: No edema.  Skin:    General: Skin is warm.     Capillary Refill: Capillary refill takes less than 2 seconds.  Neurological:     General: No focal deficit present.     Mental Status: He is alert.     ED Results / Procedures /  Treatments   Labs (all labs ordered are listed, but only abnormal results are displayed) Labs Reviewed  COMPREHENSIVE METABOLIC PANEL - Abnormal; Notable for the following components:      Result Value   Sodium 133 (*)    Chloride 97 (*)    Creatinine, Ser 1.28 (*)    Calcium 8.7 (*)    Total Bilirubin 1.3 (*)    All other components within normal limits  CBC WITH DIFFERENTIAL/PLATELET - Abnormal; Notable for the following components:   RDW 11.4 (*)    All other components within normal limits  RESP PANEL BY RT-PCR (FLU A&B, COVID) ARPGX2  LIPASE, BLOOD    EKG None  Radiology CT Abdomen Pelvis W Contrast  Result Date: 12/01/2020 CLINICAL DATA:  Acute abdominal pain and distension, generalized abdominal pain with tenderness, diarrhea and nausea since Saturday EXAM: CT ABDOMEN AND PELVIS WITH CONTRAST TECHNIQUE: Multidetector CT imaging of the abdomen and pelvis was performed using the standard protocol following bolus administration of intravenous contrast. Sagittal and coronal MPR images reconstructed from axial data set. CONTRAST:  OMNIPAQUE IOHEXOL 300 MG/ML SOLN IV. Dilute oral contrast. COMPARISON:  None FINDINGS: Lower chest: Lung bases clear Hepatobiliary: Gallbladder and liver normal appearance Pancreas: Normal appearance Spleen: Normal appearance Adrenals/Urinary Tract: Adrenal glands, kidneys, ureters, and bladder normal appearance Stomach/Bowel: Appendix not visualized, no pericecal inflammatory process seen. Mild diverticulosis of descending and transverse colon without evidence of diverticulitis. Dilated proximal and decompressed distal small bowel loops consistent month small-bowel obstruction. Transition point appears to be in the distal small bowel in the RIGHT mid abdomen laterally. Stomach unremarkable. Vascular/Lymphatic: Few pelvic phleboliths. Aorta normal caliber. Vascular structures patent. Scattered normal size mesenteric lymph nodes. No adenopathy.  Reproductive: Unremarkable prostate gland and seminal vesicles Other: Small amount of free fluid.  No free air.  No hernia. Musculoskeletal: Unremarkable IMPRESSION: Small bowel obstruction with transition in distal small bowel in RIGHT mid abdomen laterally. Small amount of nonspecific free fluid. Distal colonic diverticulosis without evidence of diverticulitis. Electronically Signed   By: Ulyses Southward M.D.   On: 12/01/2020 12:36    Procedures Procedures (including critical care time)  Medications Ordered in ED Medications  midazolam (VERSED) injection 2 mg (has no administration in time range)  sodium chloride 0.9 % bolus 1,000 mL (0 mLs Intravenous Stopped 12/01/20 1145)  ketorolac (TORADOL) 15 MG/ML injection 15 mg (15 mg Intravenous Given 12/01/20 1021)  iohexol (OMNIPAQUE) 300 MG/ML solution 100 mL (100  mLs Intravenous Contrast Given 12/01/20 1215)    ED Course  I have reviewed the triage vital signs and the nursing notes.  Pertinent labs & imaging results that were available during my care of the patient were reviewed by me and considered in my medical decision making (see chart for details).    MDM Rules/Calculators/A&P                          Labs unremarkable white count normal chemistry normal.  CT of the pelvis pursued with p.o. and IV contrast concerning for small bowel obstruction.  NG tube ordered with 2 mg of Versed given for anxiolysis.  Case discussed with surgery spoke with surgical team member Jenning, recommending nonsurgical intervention at this time and medical admission.  Case discussed with hospitalist for admission. Final Clinical Impression(s) / ED Diagnoses Final diagnoses:  SBO (small bowel obstruction) (HCC)    Rx / DC Orders ED Discharge Orders    None       Cheryll Cockayne, MD 12/01/20 1311

## 2020-12-01 NOTE — ED Notes (Signed)
Report to Angelica at [facility]

## 2020-12-02 ENCOUNTER — Observation Stay (HOSPITAL_COMMUNITY): Payer: 59

## 2020-12-02 DIAGNOSIS — E871 Hypo-osmolality and hyponatremia: Secondary | ICD-10-CM | POA: Diagnosis present

## 2020-12-02 DIAGNOSIS — Z79890 Hormone replacement therapy (postmenopausal): Secondary | ICD-10-CM | POA: Diagnosis not present

## 2020-12-02 DIAGNOSIS — Z20822 Contact with and (suspected) exposure to covid-19: Secondary | ICD-10-CM | POA: Diagnosis present

## 2020-12-02 DIAGNOSIS — F419 Anxiety disorder, unspecified: Secondary | ICD-10-CM | POA: Diagnosis present

## 2020-12-02 DIAGNOSIS — Z801 Family history of malignant neoplasm of trachea, bronchus and lung: Secondary | ICD-10-CM | POA: Diagnosis not present

## 2020-12-02 DIAGNOSIS — K56609 Unspecified intestinal obstruction, unspecified as to partial versus complete obstruction: Secondary | ICD-10-CM | POA: Diagnosis present

## 2020-12-02 DIAGNOSIS — Z8349 Family history of other endocrine, nutritional and metabolic diseases: Secondary | ICD-10-CM | POA: Diagnosis not present

## 2020-12-02 DIAGNOSIS — R109 Unspecified abdominal pain: Secondary | ICD-10-CM | POA: Diagnosis present

## 2020-12-02 DIAGNOSIS — E039 Hypothyroidism, unspecified: Secondary | ICD-10-CM | POA: Diagnosis present

## 2020-12-02 DIAGNOSIS — Z79899 Other long term (current) drug therapy: Secondary | ICD-10-CM | POA: Diagnosis not present

## 2020-12-02 DIAGNOSIS — F32A Depression, unspecified: Secondary | ICD-10-CM | POA: Diagnosis present

## 2020-12-02 DIAGNOSIS — Z8261 Family history of arthritis: Secondary | ICD-10-CM | POA: Diagnosis not present

## 2020-12-02 LAB — BASIC METABOLIC PANEL
Anion gap: 13 (ref 5–15)
BUN: 17 mg/dL (ref 6–20)
CO2: 19 mmol/L — ABNORMAL LOW (ref 22–32)
Calcium: 7.9 mg/dL — ABNORMAL LOW (ref 8.9–10.3)
Chloride: 106 mmol/L (ref 98–111)
Creatinine, Ser: 1.28 mg/dL — ABNORMAL HIGH (ref 0.61–1.24)
GFR, Estimated: >60 mL/min (ref >60)
Glucose, Bld: 72 mg/dL (ref 70–99)
Potassium: 3.8 mmol/L (ref 3.5–5.1)
Sodium: 138 mmol/L (ref 135–145)

## 2020-12-02 LAB — TSH: TSH: 6.047 u[IU]/mL — ABNORMAL HIGH (ref 0.350–4.500)

## 2020-12-02 LAB — CBC
HCT: 40.8 % (ref 39.0–52.0)
Hemoglobin: 13.7 g/dL (ref 13.0–17.0)
MCH: 29.1 pg (ref 26.0–34.0)
MCHC: 33.6 g/dL (ref 30.0–36.0)
MCV: 86.6 fL (ref 80.0–100.0)
Platelets: 167 10*3/uL (ref 150–400)
RBC: 4.71 MIL/uL (ref 4.22–5.81)
RDW: 11.5 % (ref 11.5–15.5)
WBC: 6 10*3/uL (ref 4.0–10.5)
nRBC: 0 % (ref 0.0–0.2)

## 2020-12-02 LAB — HIV ANTIBODY (ROUTINE TESTING W REFLEX): HIV Screen 4th Generation wRfx: NONREACTIVE

## 2020-12-02 MED ORDER — LEVOTHYROXINE SODIUM 100 MCG/5ML IV SOLN: 75.0000 ug | Freq: Every day | INTRAVENOUS | Status: DC

## 2020-12-02 NOTE — Progress Notes (Signed)
PROGRESS NOTE    Bobby Stewart  XJO:[id number] DOB: 06-Sep-1971 DOA: 12/01/2020 PCP: Waldon Merl, PA-C     Brief Narrative:  Bobby Stewart is a 49 y.o. male with medical history significant of hypothyroidism. He presented with abdominal pain. He reports that he had some diarrhea starting about 6 days ago. He thought it was food poisoning. It continued for 3 days and finally abated. 3 days ago he started feeling nauseous, so he started taking pepto bismol. He noticed his PO intake becoming worse. Yesterday he started feeling swelling in his stomach and diffuse pain. Pepto was no longer helping. He decided to come to the ED. He reports no other aggravating or alleviating factors. He denies any other treatments.   Imaging revealed small bowel obstruction and patient was admitted to the hospital with general surgery consult.  New events last 24 hours / Subjective: Passing gas this morning, continues to have abdominal bloating  Assessment & Plan:   Active Problems:   SBO (small bowel obstruction) (HCC)   Small bowel obstruction -Appreciate general surgery following -Continue NG tube -Bowel rest, IVF   Hyponatremia -Resolved  Hypothyroidism -IV synthroid    DVT prophylaxis:  SCDs Start: 12/01/20 1722  Code Status: Full code Family Communication: No family at bedside Disposition Plan:  Status is: Inpatient  Remains inpatient appropriate because:Inpatient level of care appropriate due to severity of illness   Dispo: The patient is from: Home              Anticipated d/c is to: Home              Anticipated d/c date is: 2 days              Patient currently is not medically stable to d/c.  Continue bowel rest and NG tube    Consultants:   General surgery  Procedures:   None  Antimicrobials:  Anti-infectives (From admission, onward)   None        Objective: Vitals:   12/01/20 1345 12/01/20 1534 12/01/20 2144 12/02/20 0545  BP: (!) 123/92  132/86 (!) 145/83 128/79  Pulse: 71 (!) 59 71 66  Resp:  16 16 16   Temp:  98 F (36.7 C) 98.9 F (37.2 C) 98.6 F (37 C)  TempSrc:  Oral Oral Oral  SpO2: 99% 100% 100% 97%  Weight:      Height:        Intake/Output Summary (Last 24 hours) at 12/02/2020 1038 Last data filed at 12/02/2020 12/04/2020 Gross per 24 hour  Intake 1000 ml  Output 1400 ml  Net -400 ml   Filed Weights   12/01/20 0956  Weight: 97.5 kg    Examination:  General exam: Appears calm and comfortable  Respiratory system: Clear to auscultation. Respiratory effort normal. No respiratory distress. No conversational dyspnea.  Cardiovascular system: S1 & S2 heard, RRR. No murmurs. No pedal edema. Gastrointestinal system: Abdomen is mildly distended, soft and nontender to palpation, +NGT in place  Central nervous system: Alert and oriented. No focal neurological deficits. Speech clear.  Extremities: Symmetric in appearance  Skin: No rashes, lesions or ulcers on exposed skin  Psychiatry: Judgement and insight appear normal. Mood & affect appropriate.   Data Reviewed: I have personally reviewed following labs and imaging studies  CBC: Recent Labs  Lab 12/01/20 1012 12/02/20 0529  WBC 6.3 6.0  NEUTROABS 4.1  --   HGB 15.0 13.7  HCT 42.7 40.8  MCV 82.8 86.6  PLT 174  167   Basic Metabolic Panel: Recent Labs  Lab 12/01/20 1012 12/02/20 0529  NA 133* 138  K 3.7 3.8  CL 97* 106  CO2 26 19*  GLUCOSE 95 72  BUN 18 17  CREATININE 1.28* 1.28*  CALCIUM 8.7* 7.9*   GFR: Estimated Creatinine Clearance: 86.4 mL/min (A) (by C-G formula based on SCr of 1.28 mg/dL (H)). Liver Function Tests: Recent Labs  Lab 12/01/20 1012  AST 28  ALT 35  ALKPHOS 51  BILITOT 1.3*  PROT 7.0  ALBUMIN 4.1   Recent Labs  Lab 12/01/20 1012  LIPASE 19   No results for input(s): AMMONIA in the last 168 hours. Coagulation Profile: No results for input(s): INR, PROTIME in the last 168 hours. Cardiac Enzymes: No results for  input(s): CKTOTAL, CKMB, CKMBINDEX, TROPONINI in the last 168 hours. BNP (last 3 results) No results for input(s): PROBNP in the last 8760 hours. HbA1C: No results for input(s): HGBA1C in the last 72 hours. CBG: No results for input(s): GLUCAP in the last 168 hours. Lipid Profile: No results for input(s): CHOL, HDL, LDLCALC, TRIG, CHOLHDL, LDLDIRECT in the last 72 hours. Thyroid Function Tests: No results for input(s): TSH, T4TOTAL, FREET4, T3FREE, THYROIDAB in the last 72 hours. Anemia Panel: No results for input(s): VITAMINB12, FOLATE, FERRITIN, TIBC, IRON, RETICCTPCT in the last 72 hours. Sepsis Labs: No results for input(s): PROCALCITON, LATICACIDVEN in the last 168 hours.  Recent Results (from the past 240 hour(s))  Resp Panel by RT-PCR (Flu A&B, Covid) Nasopharyngeal Swab     Status: None   Collection Time: 12/01/20 12:54 PM   Specimen: Nasopharyngeal Swab; Nasopharyngeal(NP) swabs in vial transport medium  Result Value Ref Range Status   SARS Coronavirus 2 by RT PCR NEGATIVE NEGATIVE Final    Comment: (NOTE) SARS-CoV-2 target nucleic acids are NOT DETECTED.  The SARS-CoV-2 RNA is generally detectable in upper respiratory specimens during the acute phase of infection. The lowest concentration of SARS-CoV-2 viral copies this assay can detect is 138 copies/mL. A negative result does not preclude SARS-Cov-2 infection and should not be used as the sole basis for treatment or other patient management decisions. A negative result may occur with  improper specimen collection/handling, submission of specimen other than nasopharyngeal swab, presence of viral mutation(s) within the areas targeted by this assay, and inadequate number of viral copies(<138 copies/mL). A negative result must be combined with clinical observations, patient history, and epidemiological information. The expected result is Negative.  Fact Sheet for Patients:   BloggerCourse.com  Fact Sheet for Healthcare Providers:  SeriousBroker.it  This test is no t yet approved or cleared by the [place] FDA and  has been authorized for detection and/or diagnosis of SARS-CoV-2 by FDA under an Emergency Use Authorization (EUA). This EUA will remain  in effect (meaning this test can be used) for the duration of the COVID-19 declaration under Section 564(b)(1) of the Act, 21 U.S.C.section 360bbb-3(b)(1), unless the authorization is terminated  or revoked sooner.       Influenza A by PCR NEGATIVE NEGATIVE Final   Influenza B by PCR NEGATIVE NEGATIVE Final    Comment: (NOTE) The Xpert Xpress SARS-CoV-2/FLU/RSV plus assay is intended as an aid in the diagnosis of influenza from Nasopharyngeal swab specimens and should not be used as a sole basis for treatment. Nasal washings and aspirates are unacceptable for Xpert Xpress SARS-CoV-2/FLU/RSV testing.  Fact Sheet for Patients: BloggerCourse.com  Fact Sheet for Healthcare Providers: SeriousBroker.it  This test is not yet  approved or cleared by the [place] and has been authorized for detection and/or diagnosis of SARS-CoV-2 by FDA under an Emergency Use Authorization (EUA). This EUA will remain in effect (meaning this test can be used) for the duration of the COVID-19 declaration under Section 564(b)(1) of the Act, 21 U.S.C. section 360bbb-3(b)(1), unless the authorization is terminated or revoked.  Performed at [facility], [address] [place], [place] [address]       Radiology Studies: DG Abd 1 View  Result Date: 12/02/2020 CLINICAL DATA:  Small bowel obstruction EXAM: ABDOMEN - 1 VIEW COMPARISON:  Abdominal CT from yesterday FINDINGS: Enteric tube tip and side-port in good position. Enteric contrast has reached the distal colon. There is still dilated small bowel  seen in the right abdomen and measuring up to 4 cm in diameter. No concerning mass effect. Clear lung bases. IMPRESSION: Persistent dilated small bowel but a partial obstruction as oral contrast has reached the distal colon. Electronically Signed   By: Marnee Spring M.D.   On: 12/02/2020 05:33   CT Abdomen Pelvis W Contrast  Result Date: 12/01/2020 CLINICAL DATA:  Acute abdominal pain and distension, generalized abdominal pain with tenderness, diarrhea and nausea since Saturday EXAM: CT ABDOMEN AND PELVIS WITH CONTRAST TECHNIQUE: Multidetector CT imaging of the abdomen and pelvis was performed using the standard protocol following bolus administration of intravenous contrast. Sagittal and coronal MPR images reconstructed from axial data set. CONTRAST:  OMNIPAQUE IOHEXOL 300 MG/ML SOLN IV. Dilute oral contrast. COMPARISON:  None FINDINGS: Lower chest: Lung bases clear Hepatobiliary: Gallbladder and liver normal appearance Pancreas: Normal appearance Spleen: Normal appearance Adrenals/Urinary Tract: Adrenal glands, kidneys, ureters, and bladder normal appearance Stomach/Bowel: Appendix not visualized, no pericecal inflammatory process seen. Mild diverticulosis of descending and transverse colon without evidence of diverticulitis. Dilated proximal and decompressed distal small bowel loops consistent month small-bowel obstruction. Transition point appears to be in the distal small bowel in the RIGHT mid abdomen laterally. Stomach unremarkable. Vascular/Lymphatic: Few pelvic phleboliths. Aorta normal caliber. Vascular structures patent. Scattered normal size mesenteric lymph nodes. No adenopathy. Reproductive: Unremarkable prostate gland and seminal vesicles Other: Small amount of free fluid.  No free air.  No hernia. Musculoskeletal: Unremarkable IMPRESSION: Small bowel obstruction with transition in distal small bowel in RIGHT mid abdomen laterally. Small amount of nonspecific free fluid. Distal colonic  diverticulosis without evidence of diverticulitis. Electronically Signed   By: Ulyses Southward M.D.   On: 12/01/2020 12:36   DG Chest Port 1 View  Result Date: 12/01/2020 CLINICAL DATA:  Nasogastric tube placement EXAM: PORTABLE CHEST 1 VIEW COMPARISON:  CT abdomen and pelvis December 01, 2020 FINDINGS: Nasogastric tube tip and side port are in the stomach. There is no edema or airspace opacity. Heart is upper normal in size with pulmonary vascularity normal. Visualized bowel gas pattern unremarkable. IMPRESSION: Nasogastric tube tip and side port in stomach. No edema or airspace opacity. Heart upper normal in size. Electronically Signed   By: Bretta Bang III M.D.   On: 12/01/2020 14:06      Scheduled Meds: Continuous Infusions: . sodium chloride 100 mL/hr at 12/01/20 1631     LOS: 0 days      Time spent: 35 minutes   Noralee Stain, DO [facility] 12/02/2020, 10:38 AM   Available via Epic secure chat 7am-7pm After these hours, please refer to coverage provider listed on [email or web address]

## 2020-12-02 NOTE — Progress Notes (Signed)
Subjective/Chief Complaint: Feels better than yesterday   Objective: Vital signs in last 24 hours: Temp:  [97.7 F (36.5 C)-98.9 F (37.2 C)] 98.6 F (37 C) (12/23 0545) Pulse Rate:  [54-71] 66 (12/23 0545) Resp:  [16-18] 16 (12/23 0545) BP: (122-145)/(79-96) 128/79 (12/23 0545) SpO2:  [97 %-100 %] 97 % (12/23 0545) Weight:  [97.5 kg] 97.5 kg (12/22 0956) Last BM Date: 11/30/20  Intake/Output from previous day: 12/22 0701 - 12/23 0700 In: 1000 [IV Piggyback:1000] Out: 1400 [Urine:400; Emesis/NG output:1000] Intake/Output this shift: No intake/output data recorded.  General appearance: alert and cooperative Resp: clear to auscultation bilaterally Cardio: regular rate and rhythm GI: soft, nontender.  Lab Results:  Recent Labs    12/01/20 1012 12/02/20 0529  WBC 6.3 6.0  HGB 15.0 13.7  HCT 42.7 40.8  PLT 174 167   BMET Recent Labs    12/01/20 1012 12/02/20 0529  NA 133* 138  K 3.7 3.8  CL 97* 106  CO2 26 19*  GLUCOSE 95 72  BUN 18 17  CREATININE 1.28* 1.28*  CALCIUM 8.7* 7.9*   PT/INR No results for input(s): LABPROT, INR in the last 72 hours. ABG No results for input(s): PHART, HCO3 in the last 72 hours.  Invalid input(s): PCO2, PO2  Studies/Results: DG Abd 1 View  Result Date: 12/02/2020 CLINICAL DATA:  Small bowel obstruction EXAM: ABDOMEN - 1 VIEW COMPARISON:  Abdominal CT from yesterday FINDINGS: Enteric tube tip and side-port in good position. Enteric contrast has reached the distal colon. There is still dilated small bowel seen in the right abdomen and measuring up to 4 cm in diameter. No concerning mass effect. Clear lung bases. IMPRESSION: Persistent dilated small bowel but a partial obstruction as oral contrast has reached the distal colon. Electronically Signed   By: Marnee Spring M.D.   On: 12/02/2020 05:33   CT Abdomen Pelvis W Contrast  Result Date: 12/01/2020 CLINICAL DATA:  Acute abdominal pain and distension, generalized  abdominal pain with tenderness, diarrhea and nausea since Saturday EXAM: CT ABDOMEN AND PELVIS WITH CONTRAST TECHNIQUE: Multidetector CT imaging of the abdomen and pelvis was performed using the standard protocol following bolus administration of intravenous contrast. Sagittal and coronal MPR images reconstructed from axial data set. CONTRAST:  OMNIPAQUE IOHEXOL 300 MG/ML SOLN IV. Dilute oral contrast. COMPARISON:  None FINDINGS: Lower chest: Lung bases clear Hepatobiliary: Gallbladder and liver normal appearance Pancreas: Normal appearance Spleen: Normal appearance Adrenals/Urinary Tract: Adrenal glands, kidneys, ureters, and bladder normal appearance Stomach/Bowel: Appendix not visualized, no pericecal inflammatory process seen. Mild diverticulosis of descending and transverse colon without evidence of diverticulitis. Dilated proximal and decompressed distal small bowel loops consistent month small-bowel obstruction. Transition point appears to be in the distal small bowel in the RIGHT mid abdomen laterally. Stomach unremarkable. Vascular/Lymphatic: Few pelvic phleboliths. Aorta normal caliber. Vascular structures patent. Scattered normal size mesenteric lymph nodes. No adenopathy. Reproductive: Unremarkable prostate gland and seminal vesicles Other: Small amount of free fluid.  No free air.  No hernia. Musculoskeletal: Unremarkable IMPRESSION: Small bowel obstruction with transition in distal small bowel in RIGHT mid abdomen laterally. Small amount of nonspecific free fluid. Distal colonic diverticulosis without evidence of diverticulitis. Electronically Signed   By: Ulyses Southward M.D.   On: 12/01/2020 12:36   DG Chest Port 1 View  Result Date: 12/01/2020 CLINICAL DATA:  Nasogastric tube placement EXAM: PORTABLE CHEST 1 VIEW COMPARISON:  CT abdomen and pelvis December 01, 2020 FINDINGS: Nasogastric tube tip and side port  are in the stomach. There is no edema or airspace opacity. Heart is upper normal in  size with pulmonary vascularity normal. Visualized bowel gas pattern unremarkable. IMPRESSION: Nasogastric tube tip and side port in stomach. No edema or airspace opacity. Heart upper normal in size. Electronically Signed   By: Bretta Bang III M.D.   On: 12/01/2020 14:06    Anti-infectives: Anti-infectives (From admission, onward)   None      Assessment/Plan: s/p * No surgery found * abd xray shows less dilated loops of small bowel and contrast is in colon  sbo seems to be resolving. Would continue ng another day Will follow  LOS: 0 days    Chevis Pretty III 12/02/2020

## 2020-12-03 ENCOUNTER — Inpatient Hospital Stay (HOSPITAL_COMMUNITY): Payer: 59

## 2020-12-03 DIAGNOSIS — E871 Hypo-osmolality and hyponatremia: Secondary | ICD-10-CM

## 2020-12-03 LAB — CBC
HCT: 39.6 % (ref 39.0–52.0)
Hemoglobin: 13.5 g/dL (ref 13.0–17.0)
MCH: 29.2 pg (ref 26.0–34.0)
MCHC: 34.1 g/dL (ref 30.0–36.0)
MCV: 85.5 fL (ref 80.0–100.0)
Platelets: 167 10*3/uL (ref 150–400)
RBC: 4.63 MIL/uL (ref 4.22–5.81)
RDW: 11.4 % — ABNORMAL LOW (ref 11.5–15.5)
WBC: 8.2 10*3/uL (ref 4.0–10.5)
nRBC: 0 % (ref 0.0–0.2)

## 2020-12-03 LAB — BASIC METABOLIC PANEL
Anion gap: 14 (ref 5–15)
BUN: 15 mg/dL (ref 6–20)
CO2: 20 mmol/L — ABNORMAL LOW (ref 22–32)
Calcium: 8 mg/dL — ABNORMAL LOW (ref 8.9–10.3)
Chloride: 105 mmol/L (ref 98–111)
Creatinine, Ser: 1.14 mg/dL (ref 0.61–1.24)
GFR, Estimated: >60 mL/min (ref >60)
Glucose, Bld: 81 mg/dL (ref 70–99)
Potassium: 3.6 mmol/L (ref 3.5–5.1)
Sodium: 139 mmol/L (ref 135–145)

## 2020-12-03 LAB — T4, FREE: Free T4: 1.42 ng/dL — ABNORMAL HIGH (ref 0.61–1.12)

## 2020-12-03 NOTE — Progress Notes (Signed)
Subjective/Chief Complaint: Feels better. Passing flatus and having bm   Objective: Vital signs in last 24 hours: Temp:  [98.2 F (36.8 C)-100 F (37.8 C)] 100 F (37.8 C) (12/24 0528) Pulse Rate:  [66-83] 83 (12/24 0528) Resp:  [15-20] 20 (12/24 0528) BP: (122-135)/(65-83) 122/83 (12/24 0528) SpO2:  [98 %-99 %] 98 % (12/24 0528) Last BM Date: 11/30/20  Intake/Output from previous day: 12/23 0701 - 12/24 0700 In: 2606.8 [P.O.:180; I.V.:2426.8] Out: 2100 [Emesis/NG output:2100] Intake/Output this shift: No intake/output data recorded.  General appearance: alert and cooperative Resp: clear to auscultation bilaterally Cardio: regular rate and rhythm GI: soft, nontender. not distended  Lab Results:  Recent Labs    12/02/20 0529 12/03/20 0509  WBC 6.0 8.2  HGB 13.7 13.5  HCT 40.8 39.6  PLT 167 167   BMET Recent Labs    12/02/20 0529 12/03/20 0509  NA 138 139  K 3.8 3.6  CL 106 105  CO2 19* 20*  GLUCOSE 72 81  BUN 17 15  CREATININE 1.28* 1.14  CALCIUM 7.9* 8.0*   PT/INR No results for input(s): LABPROT, INR in the last 72 hours. ABG No results for input(s): PHART, HCO3 in the last 72 hours.  Invalid input(s): PCO2, PO2  Studies/Results: DG Abd 1 View  Result Date: 12/02/2020 CLINICAL DATA:  Small bowel obstruction EXAM: ABDOMEN - 1 VIEW COMPARISON:  Abdominal CT from yesterday FINDINGS: Enteric tube tip and side-port in good position. Enteric contrast has reached the distal colon. There is still dilated small bowel seen in the right abdomen and measuring up to 4 cm in diameter. No concerning mass effect. Clear lung bases. IMPRESSION: Persistent dilated small bowel but a partial obstruction as oral contrast has reached the distal colon. Electronically Signed   By: Marnee Spring M.D.   On: 12/02/2020 05:33   CT Abdomen Pelvis W Contrast  Result Date: 12/01/2020 CLINICAL DATA:  Acute abdominal pain and distension, generalized abdominal pain with  tenderness, diarrhea and nausea since Saturday EXAM: CT ABDOMEN AND PELVIS WITH CONTRAST TECHNIQUE: Multidetector CT imaging of the abdomen and pelvis was performed using the standard protocol following bolus administration of intravenous contrast. Sagittal and coronal MPR images reconstructed from axial data set. CONTRAST:  OMNIPAQUE IOHEXOL 300 MG/ML SOLN IV. Dilute oral contrast. COMPARISON:  None FINDINGS: Lower chest: Lung bases clear Hepatobiliary: Gallbladder and liver normal appearance Pancreas: Normal appearance Spleen: Normal appearance Adrenals/Urinary Tract: Adrenal glands, kidneys, ureters, and bladder normal appearance Stomach/Bowel: Appendix not visualized, no pericecal inflammatory process seen. Mild diverticulosis of descending and transverse colon without evidence of diverticulitis. Dilated proximal and decompressed distal small bowel loops consistent month small-bowel obstruction. Transition point appears to be in the distal small bowel in the RIGHT mid abdomen laterally. Stomach unremarkable. Vascular/Lymphatic: Few pelvic phleboliths. Aorta normal caliber. Vascular structures patent. Scattered normal size mesenteric lymph nodes. No adenopathy. Reproductive: Unremarkable prostate gland and seminal vesicles Other: Small amount of free fluid.  No free air.  No hernia. Musculoskeletal: Unremarkable IMPRESSION: Small bowel obstruction with transition in distal small bowel in RIGHT mid abdomen laterally. Small amount of nonspecific free fluid. Distal colonic diverticulosis without evidence of diverticulitis. Electronically Signed   By: Ulyses Southward M.D.   On: 12/01/2020 12:36   DG Chest Port 1 View  Result Date: 12/01/2020 CLINICAL DATA:  Nasogastric tube placement EXAM: PORTABLE CHEST 1 VIEW COMPARISON:  CT abdomen and pelvis December 01, 2020 FINDINGS: Nasogastric tube tip and side port are in the stomach.  There is no edema or airspace opacity. Heart is upper normal in size with pulmonary  vascularity normal. Visualized bowel gas pattern unremarkable. IMPRESSION: Nasogastric tube tip and side port in stomach. No edema or airspace opacity. Heart upper normal in size. Electronically Signed   By: Bretta Bang III M.D.   On: 12/01/2020 14:06    Anti-infectives: Anti-infectives (From admission, onward)   None      Assessment/Plan: s/p * No surgery found * Advance diet. Start clears D/c ng  LOS: 1 day    Chevis Pretty III 12/03/2020

## 2020-12-03 NOTE — Progress Notes (Signed)
Subjective/Chief Complaint: Complains of ng tube. Passing flatus and having bm's   Objective: Vital signs in last 24 hours: Temp:  [98.2 F (36.8 C)-100 F (37.8 C)] 100 F (37.8 C) (12/24 0528) Pulse Rate:  [66-83] 83 (12/24 0528) Resp:  [15-20] 20 (12/24 0528) BP: (122-135)/(65-83) 122/83 (12/24 0528) SpO2:  [98 %-99 %] 98 % (12/24 0528) Last BM Date: 11/30/20  Intake/Output from previous day: 12/23 0701 - 12/24 0700 In: 2606.8 [P.O.:180; I.V.:2426.8] Out: 2100 [Emesis/NG output:2100] Intake/Output this shift: Total I/O In: 0  Out: 100 [Emesis/NG output:100]  General appearance: alert and cooperative Resp: clear to auscultation bilaterally Cardio: regular rate and rhythm GI: soft, nontender. not distended  Lab Results:  Recent Labs    12/02/20 0529 12/03/20 0509  WBC 6.0 8.2  HGB 13.7 13.5  HCT 40.8 39.6  PLT 167 167   BMET Recent Labs    12/02/20 0529 12/03/20 0509  NA 138 139  K 3.8 3.6  CL 106 105  CO2 19* 20*  GLUCOSE 72 81  BUN 17 15  CREATININE 1.28* 1.14  CALCIUM 7.9* 8.0*   PT/INR No results for input(s): LABPROT, INR in the last 72 hours. ABG No results for input(s): PHART, HCO3 in the last 72 hours.  Invalid input(s): PCO2, PO2  Studies/Results: DG Abd 1 View  Result Date: 12/02/2020 CLINICAL DATA:  Small bowel obstruction EXAM: ABDOMEN - 1 VIEW COMPARISON:  Abdominal CT from yesterday FINDINGS: Enteric tube tip and side-port in good position. Enteric contrast has reached the distal colon. There is still dilated small bowel seen in the right abdomen and measuring up to 4 cm in diameter. No concerning mass effect. Clear lung bases. IMPRESSION: Persistent dilated small bowel but a partial obstruction as oral contrast has reached the distal colon. Electronically Signed   By: Marnee Spring M.D.   On: 12/02/2020 05:33   CT Abdomen Pelvis W Contrast  Result Date: 12/01/2020 CLINICAL DATA:  Acute abdominal pain and distension,  generalized abdominal pain with tenderness, diarrhea and nausea since Saturday EXAM: CT ABDOMEN AND PELVIS WITH CONTRAST TECHNIQUE: Multidetector CT imaging of the abdomen and pelvis was performed using the standard protocol following bolus administration of intravenous contrast. Sagittal and coronal MPR images reconstructed from axial data set. CONTRAST:  OMNIPAQUE IOHEXOL 300 MG/ML SOLN IV. Dilute oral contrast. COMPARISON:  None FINDINGS: Lower chest: Lung bases clear Hepatobiliary: Gallbladder and liver normal appearance Pancreas: Normal appearance Spleen: Normal appearance Adrenals/Urinary Tract: Adrenal glands, kidneys, ureters, and bladder normal appearance Stomach/Bowel: Appendix not visualized, no pericecal inflammatory process seen. Mild diverticulosis of descending and transverse colon without evidence of diverticulitis. Dilated proximal and decompressed distal small bowel loops consistent month small-bowel obstruction. Transition point appears to be in the distal small bowel in the RIGHT mid abdomen laterally. Stomach unremarkable. Vascular/Lymphatic: Few pelvic phleboliths. Aorta normal caliber. Vascular structures patent. Scattered normal size mesenteric lymph nodes. No adenopathy. Reproductive: Unremarkable prostate gland and seminal vesicles Other: Small amount of free fluid.  No free air.  No hernia. Musculoskeletal: Unremarkable IMPRESSION: Small bowel obstruction with transition in distal small bowel in RIGHT mid abdomen laterally. Small amount of nonspecific free fluid. Distal colonic diverticulosis without evidence of diverticulitis. Electronically Signed   By: Ulyses Southward M.D.   On: 12/01/2020 12:36   DG Chest Port 1 View  Result Date: 12/01/2020 CLINICAL DATA:  Nasogastric tube placement EXAM: PORTABLE CHEST 1 VIEW COMPARISON:  CT abdomen and pelvis December 01, 2020 FINDINGS: Nasogastric tube tip  and side port are in the stomach. There is no edema or airspace opacity. Heart is  upper normal in size with pulmonary vascularity normal. Visualized bowel gas pattern unremarkable. IMPRESSION: Nasogastric tube tip and side port in stomach. No edema or airspace opacity. Heart upper normal in size. Electronically Signed   By: Bretta Bang III M.D.   On: 12/01/2020 14:06   DG Abd 2 Views  Result Date: 12/03/2020 CLINICAL DATA:  Small bowel obstruction EXAM: ABDOMEN - 2 VIEW COMPARISON:  Yesterday FINDINGS: Enteric tube in good position. Single gas dilated loop of small bowel in the central abdomen measuring 3.8 cm diameter. There could still be fluid-filled and dilated bowel loops in the right lower quadrant, but the degree of gaseous distention is improved. Oral contrast is again seen in the left colon. Multiple colonic diverticula. IMPRESSION: Continued improvement in small bowel dilatation. Electronically Signed   By: Marnee Spring M.D.   On: 12/03/2020 09:19    Anti-infectives: Anti-infectives (From admission, onward)   None      Assessment/Plan: s/p * No surgery found * Advance diet. Start clears today D/c ng sbo seems to be resolving ambulate  LOS: 1 day    Chevis Pretty III 12/03/2020

## 2020-12-03 NOTE — Discharge Summary (Signed)
Physician Discharge Summary  Bobby DialsChristopher Stewart OZH:[id number] DOB: 01/22/1971 DOA: 12/01/2020  PCP: Waldon MerlMartin, William C, PA-C  Admit date: 12/01/2020 Discharge date: 12/03/2020  Admitted From: Home Disposition:  Home  Recommendations for Outpatient Follow-up:  1. Follow up with PCP in 1 week 2. Recommend outpatient repeat of TSH and free T4 in 4 to 6 weeks  Discharge Condition: Stable CODE STATUS: Full  Diet recommendation:  Diet Orders (From admission, onward)    Start     Ordered   12/03/20 1000  DIET SOFT Room service appropriate? Yes; Fluid consistency: Thin  Diet effective 1000       Question Answer Comment  Room service appropriate? Yes   Fluid consistency: Thin      12/03/20 0959   12/03/20 0000  Diet - low sodium heart healthy        12/03/20 1001         Brief/Interim Summary: Bobby DialsChristopher Stewart is a 49 y.o.malewith medical history significant ofhypothyroidism. He presented with abdominal pain. He reports that he had some diarrhea starting about 6 days ago. He thought it was food poisoning. It continued for 3 days and finally abated. 3 days ago he started feeling nauseous, so he started taking pepto bismol. He noticed his PO intake becoming worse. Yesterday he started feeling swelling in his stomach and diffuse pain. Pepto was no longer helping. He decided to come to the ED. He reports no other aggravating or alleviating factors. He denies any other treatments.  Imaging revealed small bowel obstruction and patient was admitted to the hospital with general surgery consult.  He continued to improve with conservative management including NG tube, bowel rest, IV fluid.  Patient remained clinically stable, NG tube removed and diet advanced.  He was deemed stable for discharge home by general surgery.  Discharge Diagnoses:  Principal Problem:   SBO (small bowel obstruction) (HCC) Active Problems:   Hypothyroidism   Hyponatremia    Discharge  Instructions  Discharge Instructions    Call MD for:  difficulty breathing, headache or visual disturbances   Complete by: As directed    Call MD for:  extreme fatigue   Complete by: As directed    Call MD for:  hives   Complete by: As directed    Call MD for:  persistant dizziness or light-headedness   Complete by: As directed    Call MD for:  persistant nausea and vomiting   Complete by: As directed    Call MD for:  redness, tenderness, or signs of infection (pain, swelling, redness, odor or green/yellow discharge around incision site)   Complete by: As directed    Call MD for:  severe uncontrolled pain   Complete by: As directed    Call MD for:  temperature >100.4   Complete by: As directed    Diet - low sodium heart healthy   Complete by: As directed    Discharge instructions   Complete by: As directed    May shower. Diet and activity as tolerated. Soft foods for the next couple weeks   Increase activity slowly   Complete by: As directed    No wound care   Complete by: As directed      Allergies as of 12/03/2020   No Known Allergies     Medication List    TAKE these medications   calcium carbonate 500 MG chewable tablet Commonly known as: TUMS - dosed in mg elemental calcium Chew 500 mg by mouth 3 (three) times daily  as needed for indigestion or heartburn.   DULoxetine 60 MG capsule Commonly known as: Cymbalta Take 1 capsule (60 mg total) by mouth daily.   ibuprofen 200 MG tablet Commonly known as: ADVIL Take 800 mg by mouth every 6 (six) hours as needed for fever, headache or mild pain.   levothyroxine 150 MCG tablet Commonly known as: SYNTHROID Take 1 tablet (150 mcg total) by mouth daily before breakfast. Schedule a lab appointment for refills   terbinafine 250 MG tablet Commonly known as: LAMISIL Take 1 tablet (250 mg total) by mouth daily.       Follow-up Information    [facility] Follow up in 3 week(s).   Specialty: General  Surgery Contact information: [address] [place] [address] [phone]       [facility] Merl, [place]. Schedule an appointment as soon as possible for a visit in 1 week(s).   Specialty: Family Medicine Contact information: [address] [place] [address] [phone]              No Known Allergies  Consultations:  General surgery   Procedures/Studies: DG Abd 1 View  Result Date: 12/02/2020 CLINICAL DATA:  Small bowel obstruction EXAM: ABDOMEN - 1 VIEW COMPARISON:  Abdominal CT from yesterday FINDINGS: Enteric tube tip and side-port in good position. Enteric contrast has reached the distal colon. There is still dilated small bowel seen in the right abdomen and measuring up to 4 cm in diameter. No concerning mass effect. Clear lung bases. IMPRESSION: Persistent dilated small bowel but a partial obstruction as oral contrast has reached the distal colon. Electronically Signed   By: Marnee Spring M.D.   On: 12/02/2020 05:33   CT Abdomen Pelvis W Contrast  Result Date: 12/01/2020 CLINICAL DATA:  Acute abdominal pain and distension, generalized abdominal pain with tenderness, diarrhea and nausea since Saturday EXAM: CT ABDOMEN AND PELVIS WITH CONTRAST TECHNIQUE: Multidetector CT imaging of the abdomen and pelvis was performed using the standard protocol following bolus administration of intravenous contrast. Sagittal and coronal MPR images reconstructed from axial data set. CONTRAST:  OMNIPAQUE IOHEXOL 300 MG/ML SOLN IV. Dilute oral contrast. COMPARISON:  None FINDINGS: Lower chest: Lung bases clear Hepatobiliary: Gallbladder and liver normal appearance Pancreas: Normal appearance Spleen: Normal appearance Adrenals/Urinary Tract: Adrenal glands, kidneys, ureters, and bladder normal appearance Stomach/Bowel: Appendix not visualized, no pericecal inflammatory process seen. Mild diverticulosis of descending and transverse colon  without evidence of diverticulitis. Dilated proximal and decompressed distal small bowel loops consistent month small-bowel obstruction. Transition point appears to be in the distal small bowel in the RIGHT mid abdomen laterally. Stomach unremarkable. Vascular/Lymphatic: Few pelvic phleboliths. Aorta normal caliber. Vascular structures patent. Scattered normal size mesenteric lymph nodes. No adenopathy. Reproductive: Unremarkable prostate gland and seminal vesicles Other: Small amount of free fluid.  No free air.  No hernia. Musculoskeletal: Unremarkable IMPRESSION: Small bowel obstruction with transition in distal small bowel in RIGHT mid abdomen laterally. Small amount of nonspecific free fluid. Distal colonic diverticulosis without evidence of diverticulitis. Electronically Signed   By: Ulyses Southward M.D.   On: 12/01/2020 12:36   DG Chest Port 1 View  Result Date: 12/01/2020 CLINICAL DATA:  Nasogastric tube placement EXAM: PORTABLE CHEST 1 VIEW COMPARISON:  CT abdomen and pelvis December 01, 2020 FINDINGS: Nasogastric tube tip and side port are in the stomach. There is no edema or airspace opacity. Heart is upper normal in size with pulmonary vascularity  normal. Visualized bowel gas pattern unremarkable. IMPRESSION: Nasogastric tube tip and side port in stomach. No edema or airspace opacity. Heart upper normal in size. Electronically Signed   By: Bretta Bang III M.D.   On: 12/01/2020 14:06   DG Abd 2 Views  Result Date: 12/03/2020 CLINICAL DATA:  Small bowel obstruction EXAM: ABDOMEN - 2 VIEW COMPARISON:  Yesterday FINDINGS: Enteric tube in good position. Single gas dilated loop of small bowel in the central abdomen measuring 3.8 cm diameter. There could still be fluid-filled and dilated bowel loops in the right lower quadrant, but the degree of gaseous distention is improved. Oral contrast is again seen in the left colon. Multiple colonic diverticula. IMPRESSION: Continued improvement in small  bowel dilatation. Electronically Signed   By: Marnee Spring M.D.   On: 12/03/2020 09:19       Discharge Exam: Vitals:   12/02/20 2115 12/03/20 0528  BP: 135/80 122/83  Pulse: 77 83  Resp: 15 20  Temp: 99.3 F (37.4 C) 100 F (37.8 C)  SpO2: 98% 98%    General: Pt is alert, awake, not in acute distress Cardiovascular: RRR, S1/S2 +, no edema Respiratory: CTA bilaterally, no wheezing, no rhonchi, no respiratory distress, no conversational dyspnea  Abdominal: Soft, NT, ND Extremities: no edema, no cyanosis Psych: Normal mood and affect, stable judgement and insight     The results of significant diagnostics from this hospitalization (including imaging, microbiology, ancillary and laboratory) are listed below for reference.     Microbiology: Recent Results (from the past 240 hour(s))  Resp Panel by RT-PCR (Flu A&B, Covid) Nasopharyngeal Swab     Status: None   Collection Time: 12/01/20 12:54 PM   Specimen: Nasopharyngeal Swab; Nasopharyngeal(NP) swabs in vial transport medium  Result Value Ref Range Status   SARS Coronavirus 2 by RT PCR NEGATIVE NEGATIVE Final    Comment: (NOTE) SARS-CoV-2 target nucleic acids are NOT DETECTED.  The SARS-CoV-2 RNA is generally detectable in upper respiratory specimens during the acute phase of infection. The lowest concentration of SARS-CoV-2 viral copies this assay can detect is 138 copies/mL. A negative result does not preclude SARS-Cov-2 infection and should not be used as the sole basis for treatment or other patient management decisions. A negative result may occur with  improper specimen collection/handling, submission of specimen other than nasopharyngeal swab, presence of viral mutation(s) within the areas targeted by this assay, and inadequate number of viral copies(<138 copies/mL). A negative result must be combined with clinical observations, patient history, and epidemiological information. The expected result is  Negative.  Fact Sheet for Patients:  BloggerCourse.com  Fact Sheet for Healthcare Providers:  SeriousBroker.it  This test is no t yet approved or cleared by the [place] FDA and  has been authorized for detection and/or diagnosis of SARS-CoV-2 by FDA under an Emergency Use Authorization (EUA). This EUA will remain  in effect (meaning this test can be used) for the duration of the COVID-19 declaration under Section 564(b)(1) of the Act, 21 U.S.C.section 360bbb-3(b)(1), unless the authorization is terminated  or revoked sooner.       Influenza A by PCR NEGATIVE NEGATIVE Final   Influenza B by PCR NEGATIVE NEGATIVE Final    Comment: (NOTE) The Xpert Xpress SARS-CoV-2/FLU/RSV plus assay is intended as an aid in the diagnosis of influenza from Nasopharyngeal swab specimens and should not be used as a sole basis for treatment. Nasal washings and aspirates are unacceptable for Xpert Xpress SARS-CoV-2/FLU/RSV testing.  Fact Sheet for  Patients: BloggerCourse.com  Fact Sheet for Healthcare Providers: SeriousBroker.it  This test is not yet approved or cleared by the [place] FDA and has been authorized for detection and/or diagnosis of SARS-CoV-2 by FDA under an Emergency Use Authorization (EUA). This EUA will remain in effect (meaning this test can be used) for the duration of the COVID-19 declaration under Section 564(b)(1) of the Act, 21 U.S.C. section 360bbb-3(b)(1), unless the authorization is terminated or revoked.  Performed at [facility], [address] [place], [place] [address]      Labs: BNP (last 3 results) No results for input(s): BNP in the last 8760 hours. Basic Metabolic Panel: Recent Labs  Lab 12/01/20 1012 12/02/20 0529 12/03/20 0509  NA 133* 138 139  K 3.7 3.8 3.6  CL 97* 106 105  CO2 26 19* 20*  GLUCOSE 95 72 81  BUN 18 17 15    CREATININE 1.28* 1.28* 1.14  CALCIUM 8.7* 7.9* 8.0*   Liver Function Tests: Recent Labs  Lab 12/01/20 1012  AST 28  ALT 35  ALKPHOS 51  BILITOT 1.3*  PROT 7.0  ALBUMIN 4.1   Recent Labs  Lab 12/01/20 1012  LIPASE 19   No results for input(s): AMMONIA in the last 168 hours. CBC: Recent Labs  Lab 12/01/20 1012 12/02/20 0529 12/03/20 0509  WBC 6.3 6.0 8.2  NEUTROABS 4.1  --   --   HGB 15.0 13.7 13.5  HCT 42.7 40.8 39.6  MCV 82.8 86.6 85.5  PLT 174 167 167   Cardiac Enzymes: No results for input(s): CKTOTAL, CKMB, CKMBINDEX, TROPONINI in the last 168 hours. BNP: Invalid input(s): POCBNP CBG: No results for input(s): GLUCAP in the last 168 hours. D-Dimer No results for input(s): DDIMER in the last 72 hours. Hgb A1c No results for input(s): HGBA1C in the last 72 hours. Lipid Profile No results for input(s): CHOL, HDL, LDLCALC, TRIG, CHOLHDL, LDLDIRECT in the last 72 hours. Thyroid function studies Recent Labs    12/02/20 0529  TSH 6.047*   Anemia work up No results for input(s): VITAMINB12, FOLATE, FERRITIN, TIBC, IRON, RETICCTPCT in the last 72 hours. Urinalysis No results found for: COLORURINE, APPEARANCEUR, LABSPEC, PHURINE, GLUCOSEU, HGBUR, BILIRUBINUR, KETONESUR, PROTEINUR, UROBILINOGEN, NITRITE, LEUKOCYTESUR Sepsis Labs Invalid input(s): PROCALCITONIN,  WBC,  LACTICIDVEN Microbiology Recent Results (from the past 240 hour(s))  Resp Panel by RT-PCR (Flu A&B, Covid) Nasopharyngeal Swab     Status: None   Collection Time: 12/01/20 12:54 PM   Specimen: Nasopharyngeal Swab; Nasopharyngeal(NP) swabs in vial transport medium  Result Value Ref Range Status   SARS Coronavirus 2 by RT PCR NEGATIVE NEGATIVE Final    Comment: (NOTE) SARS-CoV-2 target nucleic acids are NOT DETECTED.  The SARS-CoV-2 RNA is generally detectable in upper respiratory specimens during the acute phase of infection. The lowest concentration of SARS-CoV-2 viral copies this assay can  detect is 138 copies/mL. A negative result does not preclude SARS-Cov-2 infection and should not be used as the sole basis for treatment or other patient management decisions. A negative result may occur with  improper specimen collection/handling, submission of specimen other than nasopharyngeal swab, presence of viral mutation(s) within the areas targeted by this assay, and inadequate number of viral copies(<138 copies/mL). A negative result must be combined with clinical observations, patient history, and epidemiological information. The expected result is Negative.  Fact Sheet for Patients:  12/03/20  Fact Sheet for Healthcare Providers:  BloggerCourse.com  This test is no t yet approved or cleared  by the [place] and  has been authorized for detection and/or diagnosis of SARS-CoV-2 by FDA under an Emergency Use Authorization (EUA). This EUA will remain  in effect (meaning this test can be used) for the duration of the COVID-19 declaration under Section 564(b)(1) of the Act, 21 U.S.C.section 360bbb-3(b)(1), unless the authorization is terminated  or revoked sooner.       Influenza A by PCR NEGATIVE NEGATIVE Final   Influenza B by PCR NEGATIVE NEGATIVE Final    Comment: (NOTE) The Xpert Xpress SARS-CoV-2/FLU/RSV plus assay is intended as an aid in the diagnosis of influenza from Nasopharyngeal swab specimens and should not be used as a sole basis for treatment. Nasal washings and aspirates are unacceptable for Xpert Xpress SARS-CoV-2/FLU/RSV testing.  Fact Sheet for Patients: BloggerCourse.com  Fact Sheet for Healthcare Providers: SeriousBroker.it  This test is not yet approved or cleared by the [place] FDA and has been authorized for detection and/or diagnosis of SARS-CoV-2 by FDA under an Emergency Use Authorization (EUA). This EUA will remain in  effect (meaning this test can be used) for the duration of the COVID-19 declaration under Section 564(b)(1) of the Act, 21 U.S.C. section 360bbb-3(b)(1), unless the authorization is terminated or revoked.  Performed at [facility], [address] [place], [place] [address]      Patient was seen and examined on the day of discharge and was found to be in stable condition. Time coordinating discharge: 20 minutes including assessment and coordination of care, as well as examination of the patient.   SIGNED:  Noralee Stain, DO [facility] 12/03/2020, 11:43 AM

## 2020-12-03 NOTE — Progress Notes (Signed)
D/C instructions given to patient. Patient had no questions. NT or writer will wheel patient out once he is dressed  

## 2020-12-06 ENCOUNTER — Telehealth: Payer: Self-pay

## 2020-12-06 ENCOUNTER — Encounter: Payer: Self-pay | Admitting: Physician Assistant

## 2020-12-06 NOTE — Telephone Encounter (Signed)
Patient is scheduled for 12/29 at 11am with Malva Cogan.

## 2020-12-06 NOTE — Telephone Encounter (Signed)
1st attempt TCM call. No answer. 

## 2020-12-06 NOTE — Telephone Encounter (Signed)
Please call patient to schedule hospital follow up. Thank you

## 2020-12-07 ENCOUNTER — Telehealth: Payer: Self-pay

## 2020-12-07 NOTE — Telephone Encounter (Signed)
Transition Care Management Follow-up Telephone Call  Date of discharge and from where: 12/03/20-[facility] [place]  How have you been since you were released from the hospital? Doing alright  Any questions or concerns? No  Items Reviewed:  Did the pt receive and understand the discharge instructions provided? Yes   Medications obtained and verified? Yes   Other? Yes   Any new allergies since your discharge? No   Dietary orders reviewed? Yes  Do you have support at home? Yes   Home Care and Equipment/Supplies: Were home health services ordered? no If so, what is the name of the agency? n/a  Has the agency set up a time to come to the patient's home? not applicable Were any new equipment or medical supplies ordered?  No What is the name of the medical supply agency? n/a Were you able to get the supplies/equipment? not applicable Do you have any questions related to the use of the equipment or supplies? N/A  Functional Questionnaire: (I = Independent and D = Dependent) ADLs: I  Bathing/Dressing- I  Meal Prep- I  Eating- I  Maintaining continence- I  Transferring/Ambulation- I  Managing Meds- I  Follow up appointments reviewed:   PCP Hospital f/u appt confirmed? Yes  Scheduled to see Marcelline Mates on 12/08/20 @ 11:00.  Specialist Hospital f/u appt confirmed? No  Patient to call this week to schedule  Are transportation arrangements needed? No   If their condition worsens, is the pt aware to call PCP or go to the Emergency Dept.? Yes  Was the patient provided with contact information for the PCP's office or ED? Yes  Was to pt encouraged to call back with questions or concerns? Yes

## 2020-12-08 ENCOUNTER — Ambulatory Visit (INDEPENDENT_AMBULATORY_CARE_PROVIDER_SITE_OTHER): Payer: 59 | Admitting: Physician Assistant

## 2020-12-08 ENCOUNTER — Other Ambulatory Visit: Payer: Self-pay

## 2020-12-08 ENCOUNTER — Encounter: Payer: Self-pay | Admitting: Physician Assistant

## 2020-12-08 VITALS — BP 110/78 | HR 55 | Temp 98.4°F | Resp 16 | Ht 72.0 in | Wt 209.0 lb

## 2020-12-08 DIAGNOSIS — E039 Hypothyroidism, unspecified: Secondary | ICD-10-CM | POA: Diagnosis not present

## 2020-12-08 DIAGNOSIS — K56609 Unspecified intestinal obstruction, unspecified as to partial versus complete obstruction: Secondary | ICD-10-CM | POA: Diagnosis not present

## 2020-12-08 DIAGNOSIS — Z1211 Encounter for screening for malignant neoplasm of colon: Secondary | ICD-10-CM | POA: Diagnosis not present

## 2020-12-08 NOTE — Patient Instructions (Signed)
Please keep well-hydrated and get plenty of rest.  Ok to continue regular diet at this point but recommend softer foods to be easier on the throat. This should improve the further out you get from having the NG tube.  Put a humidifier in the bedroom at night to help with this. Salt-water gargles can also soothe mild irritation.  Let me know if this does not continue to improve/resolve.   I am rechecking thyroid levels not that you have been back on medication. If further off than check in ER I am going to go ahead and increase your dose of thyroid medication.   If you note any inability to pass stool or gas, any nausea or vomiting, please let us know ASAP or return to the ER.

## 2020-12-08 NOTE — Progress Notes (Signed)
Patient presents to clinic today for hospital follow-up after hospitalization for small bowel obstruction.  Initially presented to the emergency room on 12/01/2020 with complaints of abdominal distention and pain with inability to pass bowel.  CT revealed small bowel obstruction.  Patient was admitted and general surgery was consulted.  Began NG tube for decompression.  Tolerated this very well with NG removal and started on p.o. intake.  Was titrated back to normal diet.  Patient doing well without any residual symptoms and good bowel output.  As such was discharged from the hospital on 12/03/2020.  Was also incidentally noted that his TSH level was still elevated greater than 6.  Was encouraged to take his thyroid medication as directed and follow-up with his primary care provider.  Patient endorses since discharge from hospital he has been doing great overall.  Is eating and hydrating well without any nausea, vomiting, abdominal pain or constipation.  Notes good bowel output without melena, hematochezia or tenesmus.  Notes urinary habits are normal.  Patient also endorses he has been taking his levothyroxine as directed without fail.  Would like reassessment of these levels and referral to endocrinology for further evaluation as he has had a hard time managing his thyroid levels.  Past Medical History:  Diagnosis Date   Anxiety and depression    History of chickenpox    Hyperthyroidism     Current Outpatient Medications on File Prior to Visit  Medication Sig Dispense Refill   levothyroxine (SYNTHROID) 150 MCG tablet Take 1 tablet (150 mcg total) by mouth daily before breakfast. Schedule a lab appointment for refills 30 tablet 0   No current facility-administered medications on file prior to visit.    No Known Allergies  Family History  Problem Relation Age of Onset   Arthritis Mother        neck   Healthy Father    Thyroid disease Sister    Lung cancer Maternal Grandfather         Smoker   AAA (abdominal aortic aneurysm) Paternal Grandfather     Social History   Socioeconomic History   Marital status: Married    Spouse name: Not on file   Number of children: 1   Years of education: 2.5 years of college   Highest education level: Not on file  Occupational History   Occupation: Social workerMortgage Industry  Tobacco Use   Smoking status: Never Smoker   Smokeless tobacco: Never Used  Building services engineerVaping Use   Vaping Use: Never used  Substance and Sexual Activity   Alcohol use: Not Currently    Comment: occasional   Drug use: No   Sexual activity: Yes  Other Topics Concern   Not on file  Social History Narrative   Lives at home with his family.   Two cups caffeine per day.   Right-handed.   Social Determinants of Health   Financial Resource Strain: Not on file  Food Insecurity: Not on file  Transportation Needs: Not on file  Physical Activity: Not on file  Stress: Not on file  Social Connections: Not on file   Review of Systems - See HPI.  All other ROS are negative.  BP 110/78    Pulse (!) 55    Temp 98.4 F (36.9 C) (Temporal)    Resp 16    Ht 6' (1.829 m)    Wt 209 lb (94.8 kg)    SpO2 96%    BMI 28.35 kg/m   Physical Exam Vitals reviewed.  Constitutional:  Appearance: Normal appearance.  HENT:     Head: Normocephalic and atraumatic.  Cardiovascular:     Rate and Rhythm: Normal rate and regular rhythm.     Pulses: Normal pulses.     Heart sounds: Normal heart sounds.  Pulmonary:     Effort: Pulmonary effort is normal.     Breath sounds: Normal breath sounds.  Abdominal:     General: Bowel sounds are normal. There is no distension.     Palpations: There is no mass.     Tenderness: There is no abdominal tenderness.     Hernia: No hernia is present.  Musculoskeletal:     Cervical back: Neck supple.  Neurological:     General: No focal deficit present.     Mental Status: He is alert and oriented to person, place, and time.   Psychiatric:        Mood and Affect: Mood normal.     Recent Results (from the past 2160 hour(s))  Sedimentation rate     Status: None   Collection Time: 09/21/20  4:22 PM  Result Value Ref Range   Sed Rate 2 0 - 15 mm/hr  C-reactive protein     Status: None   Collection Time: 09/21/20  4:22 PM  Result Value Ref Range   CRP 2 0 - 10 mg/L  Comprehensive metabolic panel     Status: Abnormal   Collection Time: 12/01/20 10:12 AM  Result Value Ref Range   Sodium 133 (L) 135 - 145 mmol/L   Potassium 3.7 3.5 - 5.1 mmol/L   Chloride 97 (L) 98 - 111 mmol/L   CO2 26 22 - 32 mmol/L   Glucose, Bld 95 70 - 99 mg/dL    Comment: Glucose reference range applies only to samples taken after fasting for at least 8 hours.   BUN 18 6 - 20 mg/dL   Creatinine, Ser 1.75 (H) 0.61 - 1.24 mg/dL   Calcium 8.7 (L) 8.9 - 10.3 mg/dL   Total Protein 7.0 6.5 - 8.1 g/dL   Albumin 4.1 3.5 - 5.0 g/dL   AST 28 15 - 41 U/L   ALT 35 0 - 44 U/L   Alkaline Phosphatase 51 38 - 126 U/L   Total Bilirubin 1.3 (H) 0.3 - 1.2 mg/dL   GFR, Estimated >10 >25 mL/min    Comment: (NOTE) Calculated using the CKD-EPI Creatinine Equation (2021)    Anion gap 10 5 - 15    Comment: Performed at [facility], [address] [place], [place] [address]  CBC with Differential     Status: Abnormal   Collection Time: 12/01/20 10:12 AM  Result Value Ref Range   WBC 6.3 4.0 - 10.5 K/uL   RBC 5.16 4.22 - 5.81 MIL/uL   Hemoglobin 15.0 13.0 - 17.0 g/dL   HCT 82.4 23.5 - 36.1 %   MCV 82.8 80.0 - 100.0 fL   MCH 29.1 26.0 - 34.0 pg   MCHC 35.1 30.0 - 36.0 g/dL   RDW 44.3 (L) 15.4 - 00.8 %   Platelets 174 150 - 400 K/uL   nRBC 0.0 0.0 - 0.2 %   Neutrophils Relative % 64 %   Neutro Abs 4.1 1.7 - 7.7 K/uL   Lymphocytes Relative 24 %   Lymphs Abs 1.5 0.7 - 4.0 K/uL   Monocytes Relative 9 %   Monocytes Absolute 0.6 0.1 - 1.0 K/uL   Eosinophils Relative 2 %   Eosinophils Absolute 0.1 0.0 -  0.5 K/uL   Basophils Relative 0  %   Basophils Absolute 0.0 0.0 - 0.1 K/uL   Immature Granulocytes 1 %   Abs Immature Granulocytes 0.03 0.00 - 0.07 K/uL    Comment: Performed at [facility], [address] [place], [place] [address]  Lipase, blood     Status: None   Collection Time: 12/01/20 10:12 AM  Result Value Ref Range   Lipase 19 11 - 51 U/L    Comment: Performed at [facility], [address] [place], [place] [address]  Resp Panel by RT-PCR (Flu A&B, Covid) Nasopharyngeal Swab     Status: None   Collection Time: 12/01/20 12:54 PM   Specimen: Nasopharyngeal Swab; Nasopharyngeal(NP) swabs in vial transport medium  Result Value Ref Range   SARS Coronavirus 2 by RT PCR NEGATIVE NEGATIVE    Comment: (NOTE) SARS-CoV-2 target nucleic acids are NOT DETECTED.  The SARS-CoV-2 RNA is generally detectable in upper respiratory specimens during the acute phase of infection. The lowest concentration of SARS-CoV-2 viral copies this assay can detect is 138 copies/mL. A negative result does not preclude SARS-Cov-2 infection and should not be used as the sole basis for treatment or other patient management decisions. A negative result may occur with  improper specimen collection/handling, submission of specimen other than nasopharyngeal swab, presence of viral mutation(s) within the areas targeted by this assay, and inadequate number of viral copies(<138 copies/mL). A negative result must be combined with clinical observations, patient history, and epidemiological information. The expected result is Negative.  Fact Sheet for Patients:  BloggerCourse.com  Fact Sheet for Healthcare Providers:  SeriousBroker.it  This test is no t yet approved or cleared by the [place] FDA and  has been authorized for detection and/or diagnosis of SARS-CoV-2 by FDA under an Emergency Use Authorization (EUA). This EUA will remain  in effect (meaning this test can  be used) for the duration of the COVID-19 declaration under Section 564(b)(1) of the Act, 21 U.S.C.section 360bbb-3(b)(1), unless the authorization is terminated  or revoked sooner.       Influenza A by PCR NEGATIVE NEGATIVE   Influenza B by PCR NEGATIVE NEGATIVE    Comment: (NOTE) The Xpert Xpress SARS-CoV-2/FLU/RSV plus assay is intended as an aid in the diagnosis of influenza from Nasopharyngeal swab specimens and should not be used as a sole basis for treatment. Nasal washings and aspirates are unacceptable for Xpert Xpress SARS-CoV-2/FLU/RSV testing.  Fact Sheet for Patients: BloggerCourse.com  Fact Sheet for Healthcare Providers: SeriousBroker.it  This test is not yet approved or cleared by the [place] FDA and has been authorized for detection and/or diagnosis of SARS-CoV-2 by FDA under an Emergency Use Authorization (EUA). This EUA will remain in effect (meaning this test can be used) for the duration of the COVID-19 declaration under Section 564(b)(1) of the Act, 21 U.S.C. section 360bbb-3(b)(1), unless the authorization is terminated or revoked.  Performed at [facility], [address] [place], [place] [address]   CBC     Status: None   Collection Time: 12/02/20  5:29 AM  Result Value Ref Range   WBC 6.0 4.0 - 10.5 K/uL   RBC 4.71 4.22 - 5.81 MIL/uL   Hemoglobin 13.7 13.0 - 17.0 g/dL   HCT 91.4 78.2 - 95.6 %   MCV 86.6 80.0 - 100.0 fL   MCH 29.1 26.0 - 34.0 pg   MCHC 33.6 30.0 - 36.0 g/dL  RDW 11.5 11.5 - 15.5 %   Platelets 167 150 - 400 K/uL   nRBC 0.0 0.0 - 0.2 %    Comment: Performed at [facility], [address], [place], [place] [address]  Basic metabolic panel     Status: Abnormal   Collection Time: 12/02/20  5:29 AM  Result Value Ref Range   Sodium 138 135 - 145 mmol/L   Potassium 3.8 3.5 - 5.1 mmol/L   Chloride 106 98 - 111 mmol/L   CO2 19 (L) 22 - 32 mmol/L    Glucose, Bld 72 70 - 99 mg/dL    Comment: Glucose reference range applies only to samples taken after fasting for at least 8 hours.   BUN 17 6 - 20 mg/dL   Creatinine, Ser 5.28 (H) 0.61 - 1.24 mg/dL   Calcium 7.9 (L) 8.9 - 10.3 mg/dL   GFR, Estimated >41 >32 mL/min    Comment: (NOTE) Calculated using the CKD-EPI Creatinine Equation (2021)    Anion gap 13 5 - 15    Comment: Performed at [facility], [address], [place], [place] [address]  TSH     Status: Abnormal   Collection Time: 12/02/20  5:29 AM  Result Value Ref Range   TSH 6.047 (H) 0.350 - 4.500 uIU/mL    Comment: Performed by a 3rd Generation assay with a functional sensitivity of <=0.01 uIU/mL. Performed at [facility], [address], [place], [place] [address]   HIV Antibody (routine testing w rflx)     Status: None   Collection Time: 12/02/20  5:29 AM  Result Value Ref Range   HIV Screen 4th Generation wRfx Non Reactive Non Reactive    Comment: Performed at [facility], [address] [place], [place] [address]  CBC     Status: Abnormal   Collection Time: 12/03/20  5:09 AM  Result Value Ref Range   WBC 8.2 4.0 - 10.5 K/uL   RBC 4.63 4.22 - 5.81 MIL/uL   Hemoglobin 13.5 13.0 - 17.0 g/dL   HCT 34.7 42.5 - 95.6 %   MCV 85.5 80.0 - 100.0 fL   MCH 29.2 26.0 - 34.0 pg   MCHC 34.1 30.0 - 36.0 g/dL   RDW 38.7 (L) 56.4 - 33.2 %   Platelets 167 150 - 400 K/uL   nRBC 0.0 0.0 - 0.2 %    Comment: Performed at [facility], [address], [place], [place] [address]  Basic metabolic panel     Status: Abnormal   Collection Time: 12/03/20  5:09 AM  Result Value Ref Range   Sodium 139 135 - 145 mmol/L   Potassium 3.6 3.5 - 5.1 mmol/L   Chloride 105 98 - 111 mmol/L   CO2 20 (L) 22 - 32 mmol/L   Glucose, Bld 81 70 - 99 mg/dL    Comment: Glucose reference range applies only to samples taken after fasting for at least 8 hours.   BUN 15 6 - 20 mg/dL    Creatinine, Ser 4.16 0.61 - 1.24 mg/dL   Calcium 8.0 (L) 8.9 - 10.3 mg/dL   GFR, Estimated >60 >63 mL/min    Comment: (NOTE) Calculated using the CKD-EPI Creatinine Equation (2021)    Anion gap 14 5 - 15    Comment: Performed at [facility], [address], [place], [place] [address]  T4, free     Status: Abnormal   Collection Time: 12/03/20  7:58 AM  Result Value Ref Range   Free T4 1.42 (H) 0.61 - 1.12 ng/dL    Comment: (NOTE) Biotin ingestion may interfere with free T4 tests. If the results are inconsistent with the TSH level, previous test results, or the clinical presentation, then consider biotin interference. If needed, order repeat testing after stopping biotin. Performed at [facility], [address], [place], [place] [address]     Assessment/Plan: 1. SBO (small bowel obstruction) (HCC) Resolved.  Continue regular diet and good hydration.  Discussed symptoms that would be concerning for recurrence as well as ER precautions.  Patient voiced understanding and agreement with the plan.   2. Hypothyroidism, unspecified type Back on thyroid medication as directed.  We will repeat TSH today to ensure not worsening.  If so would want to go ahead and increase his dose further.  Referral to endocrinology placed. - Thyroid Panel With TSH - Ambulatory referral to Endocrinology  3. Encounter for screening colonoscopy Due.  Average risk.  Referral placed. - Ambulatory referral to Gastroenterology  This visit occurred during the SARS-CoV-2 public health emergency.  Safety protocols were in place, including screening questions prior to the visit, additional usage of staff PPE, and extensive cleaning of exam room while observing appropriate contact time as indicated for disinfecting solutions.     Piedad Climes, PA-C

## 2020-12-09 ENCOUNTER — Other Ambulatory Visit: Payer: Self-pay

## 2020-12-09 DIAGNOSIS — E039 Hypothyroidism, unspecified: Secondary | ICD-10-CM

## 2020-12-09 LAB — THYROID PANEL WITH TSH
Free Thyroxine Index: 3.7 (ref 1.4–3.8)
T3 Uptake: 34 % (ref 22–35)
T4, Total: 10.8 ug/dL — ABNORMAL HIGH (ref 4.9–10.5)
TSH: 4.24 mIU/L (ref 0.40–4.50)

## 2020-12-09 MED ORDER — LEVOTHYROXINE SODIUM 150 MCG PO TABS: 150.0000 ug | ORAL_TABLET | Freq: Every day | ORAL | 1 refills | Status: DC

## 2020-12-14 ENCOUNTER — Encounter: Payer: Self-pay | Admitting: Physician Assistant

## 2020-12-21 ENCOUNTER — Other Ambulatory Visit: Payer: 59

## 2020-12-22 ENCOUNTER — Other Ambulatory Visit: Payer: 59

## 2020-12-22 DIAGNOSIS — Z20822 Contact with and (suspected) exposure to covid-19: Secondary | ICD-10-CM

## 2020-12-24 LAB — SARS-COV-2, NAA 2 DAY TAT

## 2020-12-24 LAB — SPECIMEN STATUS REPORT

## 2020-12-24 LAB — NOVEL CORONAVIRUS, NAA: SARS-CoV-2, NAA: NOT DETECTED

## 2020-12-30 ENCOUNTER — Encounter: Payer: Self-pay | Admitting: Physician Assistant

## 2020-12-30 NOTE — Telephone Encounter (Signed)
Ear infections can happen in adults but less common. Could be eustachian tube dysfunction due to weather change. Recommend starting a daily antihistamine like Claritin or Xyzal. Also Flonase OTC. If not improving or anything worsens, will need video visit.

## 2021-01-04 ENCOUNTER — Telehealth (INDEPENDENT_AMBULATORY_CARE_PROVIDER_SITE_OTHER): Payer: 59 | Admitting: Physician Assistant

## 2021-01-04 ENCOUNTER — Encounter: Payer: Self-pay | Admitting: Physician Assistant

## 2021-01-04 ENCOUNTER — Other Ambulatory Visit: Payer: Self-pay

## 2021-01-04 DIAGNOSIS — H66002 Acute suppurative otitis media without spontaneous rupture of ear drum, left ear: Secondary | ICD-10-CM

## 2021-01-04 MED ORDER — CEFDINIR 300 MG PO CAPS: 300.0000 mg | ORAL_CAPSULE | Freq: Two times a day (BID) | ORAL | 0 refills | Status: DC

## 2021-01-04 NOTE — Progress Notes (Signed)
I have discussed the procedure for the virtual visit with the patient who has given consent to proceed with assessment and treatment.   Rafaella Kole S Daxten Kovalenko, CMA     

## 2021-01-04 NOTE — Progress Notes (Signed)
Virtual Visit via Telephone Note  I connected with Bobby Stewart on 01/04/21 at  9:00 AM EST by telephone and verified that I am speaking with the correct person using two identifiers.  Location: Patient: Home Provider: [facility]   I discussed the limitations, risks, security and privacy concerns of performing an evaluation and management service by telephone and the availability of in person appointments. I also discussed with the patient that there may be a patient responsible charge related to this service. The patient expressed understanding and agreed to proceed.  History of Present Illness: Patient endorses continued ear pressure, fullness with occasional pain. Mainly of the L ear but sometimes in the R ear. Denies nasal congestion, sinus pressure, sinus pain, ear swelling or drainage. Notes decreased hearing of L ear. Denies dizziness, lightheadedness. Has taken Claritin with little relief in symptoms.    Observations/Objective: No labored breathing.  Speech is clear and coherent with logical content.  Patient is alert and oriented at baseline.   Assessment and Plan: 1. Non-recurrent acute suppurative otitis media of left ear without spontaneous rupture of tympanic membrane Concern for this secondary to eustachian tube dysfunction. Start Claritin-D or Sudafed, Flonase. Will add-on Cefdinir. If not improving/resolving will need in-office evaluation.  - cefdinir (OMNICEF) 300 MG capsule; Take 1 capsule (300 mg total) by mouth 2 (two) times daily.  Dispense: 14 capsule; Refill: 0   Follow Up Instructions:  I discussed the assessment and treatment plan with the patient. The patient was provided an opportunity to ask questions and all were answered. The patient agreed with the plan and demonstrated an understanding of the instructions.   The patient was advised to call back or seek an in-person evaluation if the symptoms worsen or if the condition fails to improve as  anticipated.  I provided 12 minutes of non-face-to-face time during this encounter.   Piedad Climes, PA-C

## 2021-01-06 ENCOUNTER — Other Ambulatory Visit: Payer: Self-pay

## 2021-01-06 ENCOUNTER — Other Ambulatory Visit (INDEPENDENT_AMBULATORY_CARE_PROVIDER_SITE_OTHER): Payer: 59

## 2021-01-06 ENCOUNTER — Ambulatory Visit: Payer: 59

## 2021-01-06 DIAGNOSIS — E039 Hypothyroidism, unspecified: Secondary | ICD-10-CM | POA: Diagnosis not present

## 2021-01-06 LAB — T4, FREE: Free T4: 1.22 ng/dL (ref 0.60–1.60)

## 2021-01-06 LAB — TSH: TSH: 2.37 u[IU]/mL (ref 0.35–4.50)

## 2021-01-09 ENCOUNTER — Encounter: Payer: Self-pay | Admitting: Physician Assistant

## 2021-01-17 ENCOUNTER — Encounter: Payer: Self-pay | Admitting: Internal Medicine

## 2021-01-17 NOTE — Progress Notes (Signed)
Name: Bobby Stewart  MRN/ DOB: [id number], 08-Mar-1971    Age/ Sex: 50 y.o., male    PCP: Bobby Stewart   Reason for Endocrinology Evaluation: Hypothyroidism     Date of Initial Endocrinology Evaluation: 01/18/2021     HPI: [name] is a 50 y.o. male with a past medical history of Hypothyroidism . The patient presented for initial endocrinology clinic visit on 01/18/2021 for consultative assistance with his Hypothyroidism.   He was diagnosed with hypothyroidism in 2019,  he was having imbalance issues at the time. Started LT-4 replacement at the time    Has noted  drenching sweats since hospitalization for small intestinal obstruction in 11/2020 -this has been gradually improving   Denies diarrhea or constipation  Denies local neck symptoms  Has behind Jaw pain, seen multiple specialists for it. 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 Anxiety and depression   . History of chickenpox   . Hyperthyroidism    Past Surgical History:  Past Surgical History:  Procedure Laterality Date  . NO PAST SURGERIES        Social History:  reports that he has never smoked. He has never used smokeless tobacco. He reports previous alcohol use. He reports that he does not use drugs.  Family History: family history includes AAA (abdominal aortic aneurysm) in his paternal grandfather; Arthritis in his mother; Healthy in his father; Lung cancer in his maternal grandfather; Thyroid disease in his sister.   HOME MEDICATIONS: Allergies as of 01/18/2021   No Known Allergies     Medication List       Accurate as of January 18, 2021  9:17 AM. If you have any questions, ask your nurse or doctor.        STOP taking these medications   cefdinir 300 MG  capsule Commonly known as: OMNICEF Stopped by: Bobby Shorts, MD     TAKE these medications   levothyroxine 150 MCG tablet Commonly known as: SYNTHROID Take 1 tablet (150 mcg total) by mouth daily before breakfast. Schedule a lab appointment for refills         REVIEW OF SYSTEMS: A comprehensive ROS was conducted with the patient and is negative except as per HPI     OBJECTIVE:  VS: BP 110/70   Pulse (!) 56   Ht 6' (1.829 m)   Wt 216 lb 6 oz (98.1 kg)   SpO2 98%   BMI 29.35 kg/m    Wt Readings from Last 3 Encounters:  01/18/21 216 lb 6 oz (98.1 kg)  12/08/20 209 lb (94.8 kg)  12/01/20 215 lb (97.5 kg)     EXAM: General: Pt appears well and is in NAD  Eyes: External eye exam normal without stare, lid lag or exophthalmos.  EOM intact.   Neck: General: Supple without adenopathy. Thyroid: Thyroid size normal.  No goiter or nodules appreciated.   Lungs: Clear with good BS bilat with no rales, rhonchi, or wheezes  Heart: Auscultation: RRR.  Abdomen: Normoactive bowel sounds, soft, nontender, without masses or organomegaly palpable  Extremities:  BL LE: No pretibial edema normal ROM and strength.  Skin: Hair: Texture and amount normal with gender appropriate distribution Skin Inspection: No rashes Skin Palpation: Skin  temperature, texture, and thickness normal to palpation  Neuro: Cranial nerves: II - XII grossly intact  Motor: Normal strength throughout DTRs: 2+ and symmetric in UE without delay in relaxation phase  Mental Status: Judgment, insight: Intact Orientation: Oriented to time, place, and person Mood and affect: No depression, anxiety, or agitation     DATA REVIEWED:     Results for AARIN, BLUETT (MRN [id number]) as of 01/18/2021 09:17  Ref. Range 01/06/2021 09:04  TSH Latest Ref Range: 0.35 - 4.50 uIU/mL 2.37  T4,Free(Direct) Latest Ref Range: 0.60 - 1.60 ng/dL 4.01    ASSESSMENT/PLAN/RECOMMENDATIONS:   1. Hypothyroidism:  - Pt with  night sweats at night, I have explained that heat intolerance is associated with hyperthyroidism  ( whether endogenous or iatrogenic) but in review of his labs, his TSH has been elevated . - Pt educated extensively on the correct way to take levothyroxine (first thing in the morning with water, 30 minutes before eating or taking other medications). - Pt encouraged to double dose the following day if she were to miss a dose given long half-life of levothyroxine.   Medications : Continue Levothyroxine 150 mcg daily    2. Night sweats:  - Heat intolerance occurs in the setting of low TSh but no evidence of this on his labs  - We discussed D/D of drenching sweats such as infectious vs lymphoma vs other causes that are beyond the scope of endocrinology  - He has a referral to GI . The reassuring point is that this has improved.  - Will check prolactin   F/U in 4 months  Labs in 6 weeks    Signed electronically by: Lyndle Herrlich, MD  [facility] Endocrinology  [facility] [address] [place] [place], [place] [address] Phone: [phone] FAX: [phone]   CC: Bobby Stewart [address] [place] [address] Phone: [phone] Fax: [phone]   Return to Endocrinology clinic as below: No future appointments.

## 2021-01-18 ENCOUNTER — Ambulatory Visit: Payer: 59 | Admitting: Internal Medicine

## 2021-01-18 ENCOUNTER — Encounter: Payer: Self-pay | Admitting: Internal Medicine

## 2021-01-18 ENCOUNTER — Other Ambulatory Visit: Payer: Self-pay

## 2021-01-18 VITALS — BP 110/70 | HR 56 | Ht 72.0 in | Wt 216.4 lb

## 2021-01-18 DIAGNOSIS — R61 Generalized hyperhidrosis: Secondary | ICD-10-CM | POA: Diagnosis not present

## 2021-01-18 DIAGNOSIS — E039 Hypothyroidism, unspecified: Secondary | ICD-10-CM | POA: Diagnosis not present

## 2021-01-18 NOTE — Patient Instructions (Signed)

## 2021-02-07 ENCOUNTER — Other Ambulatory Visit: Payer: Self-pay | Admitting: Physician Assistant

## 2021-02-07 DIAGNOSIS — E039 Hypothyroidism, unspecified: Secondary | ICD-10-CM

## 2021-02-15 ENCOUNTER — Ambulatory Visit: Payer: 59 | Admitting: Internal Medicine

## 2021-03-01 ENCOUNTER — Other Ambulatory Visit: Payer: 59

## 2021-05-24 ENCOUNTER — Ambulatory Visit: Payer: 59 | Admitting: Internal Medicine

## 2021-05-24 NOTE — Progress Notes (Deleted)
Name: Bobby Stewart  MRN/ DOB: [id number], Aug 21, 1971    Age/ Sex: 50 y.o., male     PCP: Noel Journey   Reason for Endocrinology Evaluation: Hypothyroidism     Initial Endocrinology Clinic Visit: 01/18/2021    PATIENT IDENTIFIER: Bobby Stewart is a 50 y.o., male with a past medical history of Hypothyroidism. He has followed with [facility] since 01/18/2021 for consultative assistance with management of his Hypothyroidism.   HISTORICAL SUMMARY:   He was diagnosed with hypothyroidism in 2019,  he was having imbalance issues . Started LT-4 replacement at the time     Has noted  drenching sweats since hospitalization for small intestinal obstruction in 11/2020 -this has been gradually improving    Sister with thyroid disease   SUBJECTIVE:    Today (05/24/2021):  Bobby Stewart is here for hypothyroidism.     Levothyroxine 150 mcg daily    HISTORY:  Past Medical History:  Past Medical History:  Diagnosis Date   Anxiety and depression    History of chickenpox    Hyperthyroidism    Past Surgical History:  Past Surgical History:  Procedure Laterality Date   NO PAST SURGERIES     Social History:  reports that he has never smoked. He has never used smokeless tobacco. He reports previous alcohol use. He reports that he does not use drugs. Family History:  Family History  Problem Relation Age of Onset   Arthritis Mother        neck   Healthy Father    Thyroid disease Sister    Lung cancer Maternal Grandfather        Smoker   AAA (abdominal aortic aneurysm) Paternal Grandfather      HOME MEDICATIONS: Allergies as of 05/24/2021   No Known Allergies      Medication List        Accurate as of May 24, 2021  7:23 AM. If you have any questions, ask your nurse or doctor.          levothyroxine 150 MCG tablet Commonly known as: SYNTHROID TAKE 1 TABLET BY MOUTH EVERY MORNING BEFORE BREAKFAST **NOV**           OBJECTIVE:   PHYSICAL EXAM: VS: There were no vitals taken for this visit.   EXAM: General: Pt appears well and is in NAD  Hydration: Well-hydrated with moist mucous membranes and good skin turgor  Eyes: External eye exam normal without stare, lid lag or exophthalmos.  EOM intact.  PERRL.  Ears, Nose, Throat: Hearing: Grossly intact bilaterally Dental: Good dentition  Throat: Clear without mass, erythema or exudate  Neck: General: Supple without adenopathy. Thyroid: Thyroid size normal.  No goiter or nodules appreciated. No thyroid bruit.  Lungs: Clear with good BS bilat with no rales, rhonchi, or wheezes  Heart: Auscultation: RRR.  Abdomen: Normoactive bowel sounds, soft, nontender, without masses or organomegaly palpable  Extremities: Gait and station: Normal gait  Digits and nails: No clubbing, cyanosis, petechiae, or nodes Head and neck: Normal alignment and mobility BL UE: Normal ROM and strength. BL LE: No pretibial edema normal ROM and strength.  Skin: Hair: Texture and amount normal with gender appropriate distribution Skin Inspection: No rashes, acanthosis nigricans/skin tags. No lipohypertrophy Skin Palpation: Skin temperature, texture, and thickness normal to palpation  Neuro: Cranial nerves: II - XII grossly intact  Cerebellar: Normal coordination and movement; no tremor Motor: Normal strength throughout DTRs: 2+ and symmetric in UE without delay in relaxation  phase  Mental Status: Judgment, insight: Intact Orientation: Oriented to time, place, and person Memory: Intact for recent and remote events Mood and affect: No depression, anxiety, or agitation     DATA REVIEWED:    ASSESSMENT / PLAN / RECOMMENDATIONS:     Plan:     Medications     Signed electronically by: Lyndle Herrlich, MD  [facility]  [facility] [address], [address] [place], [place] [address] Phone: [phone] FAX: [phone]       CC: Waldon Merl, PA-C [address] [place] [address] Phone: [phone]  Fax: [phone]   Return to Endocrinology clinic as below: Future Appointments  Date Time Provider Department Center  05/24/2021  9:30 AM Nicolemarie Wooley, Konrad Dolores, MD [id number]

## 2021-07-09 IMAGING — DX DG ABDOMEN 1V
2 series · 2 of 2 positions shown · non-contrast
Comparison: Abdominal CT from yesterday

CLINICAL DATA: Small bowel obstruction

EXAM:
ABDOMEN - 1 VIEW

[abdomen kub (1 of 2)]
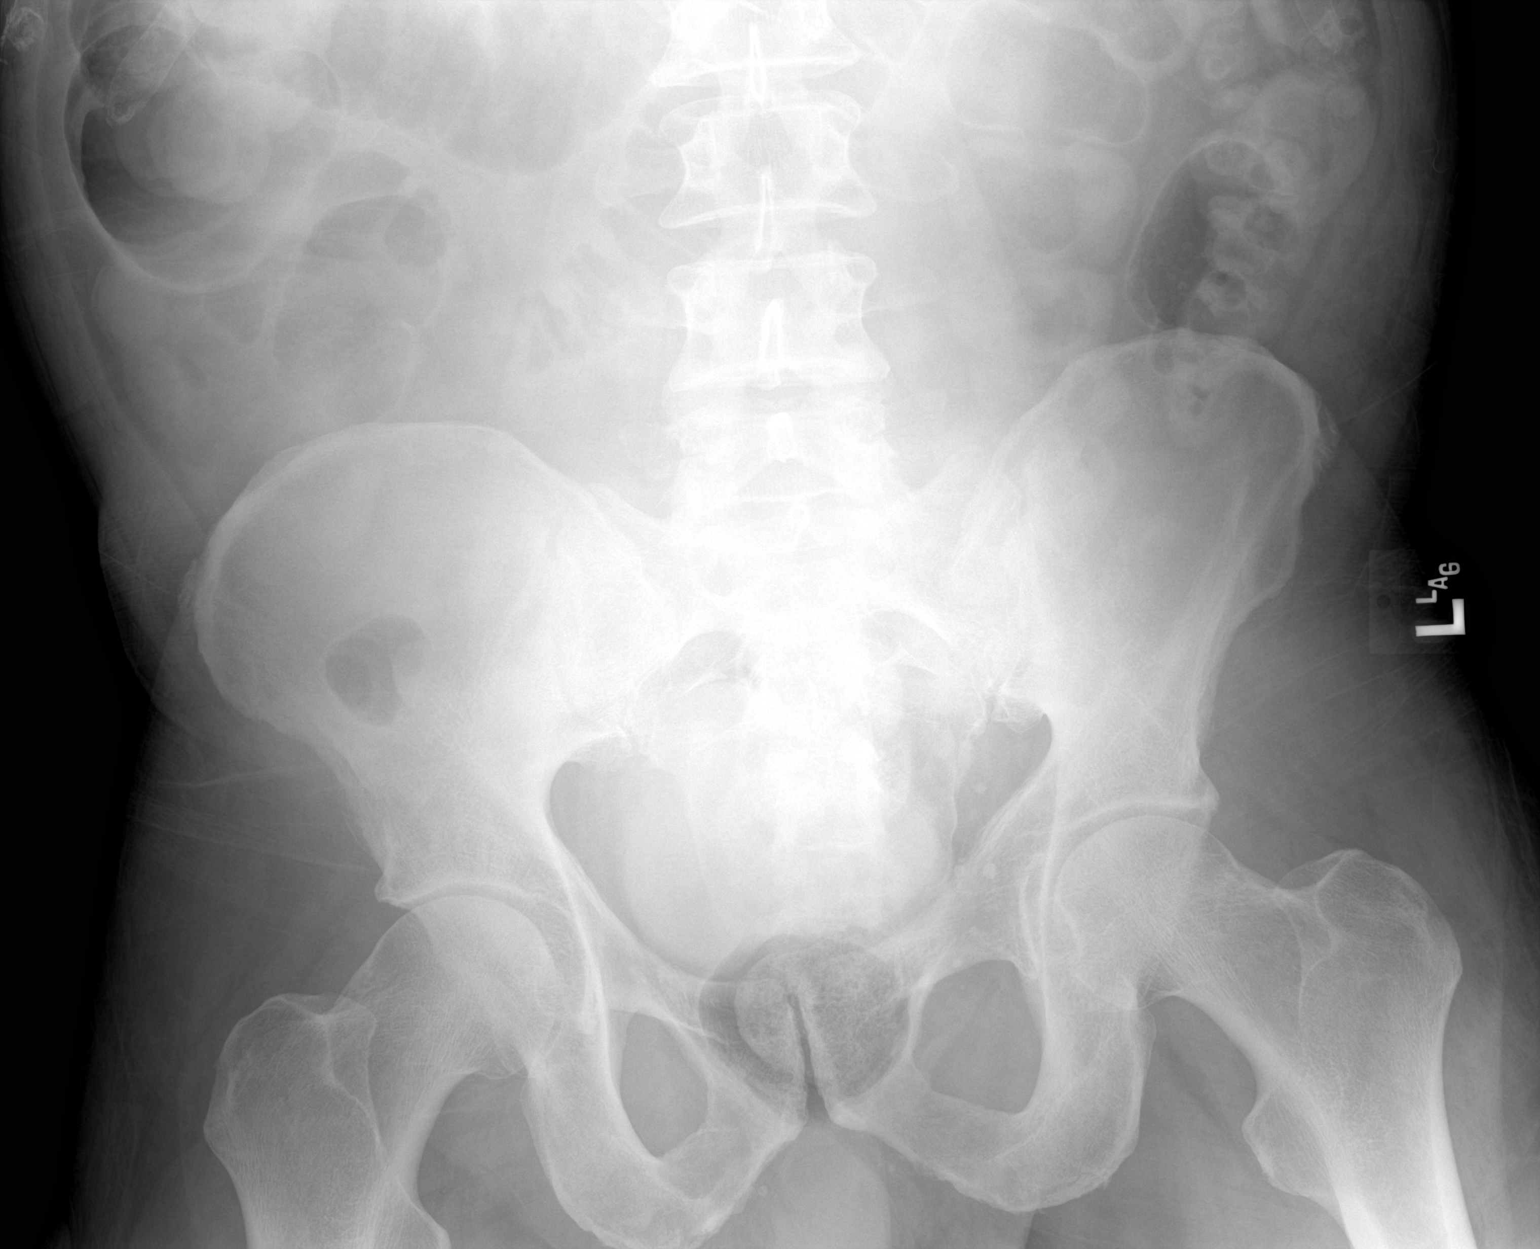

[abdomen kub (2 of 2)]
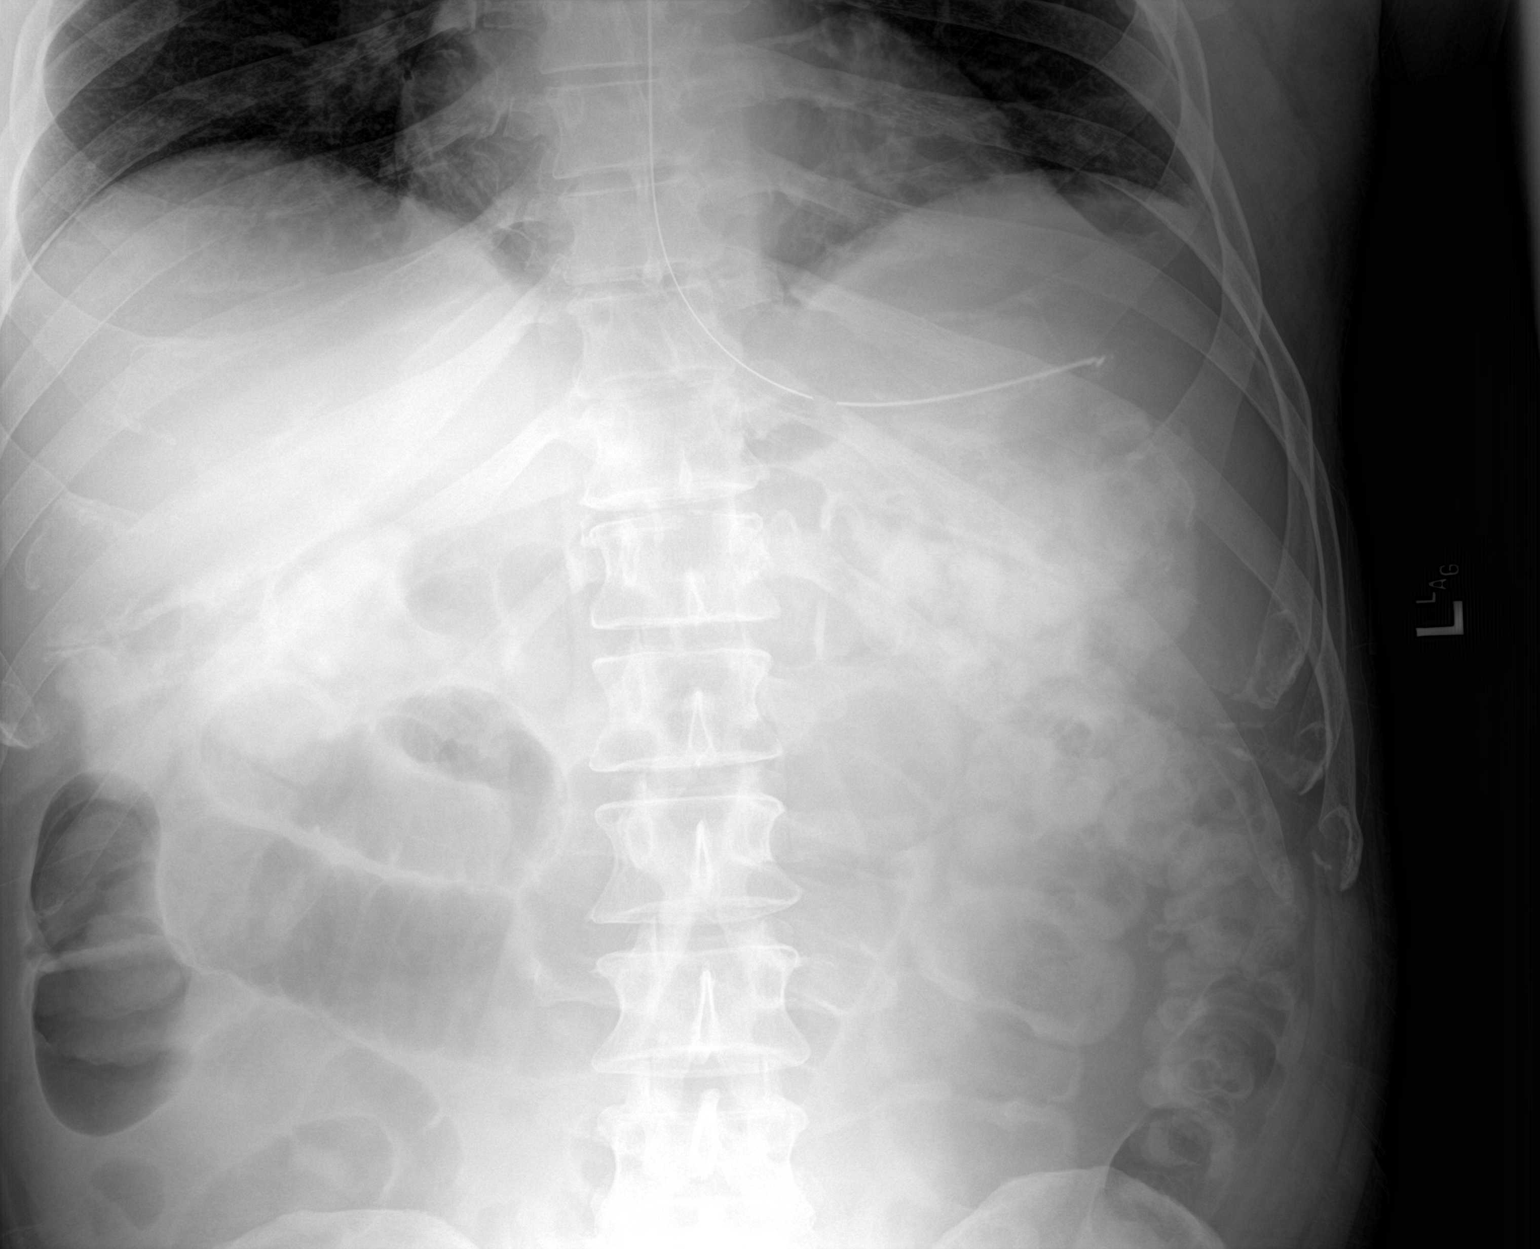

[2 of 2 positions shown; findings below may reference images not displayed]

FINDINGS: Enteric tube tip and side-port in good position. Enteric contrast
has reached the distal colon. There is still dilated small bowel
seen in the right abdomen and measuring up to 4 cm in diameter. No
concerning mass effect. Clear lung bases.
IMPRESSION: Persistent dilated small bowel but a partial obstruction as oral
contrast has reached the distal colon.

## 2021-07-10 IMAGING — DX DG ABDOMEN 2V
3 series · 3 of 3 positions shown · non-contrast
Comparison: Yesterday

CLINICAL DATA: Small bowel obstruction

EXAM:
ABDOMEN - 2 VIEW

[abdomen erect]
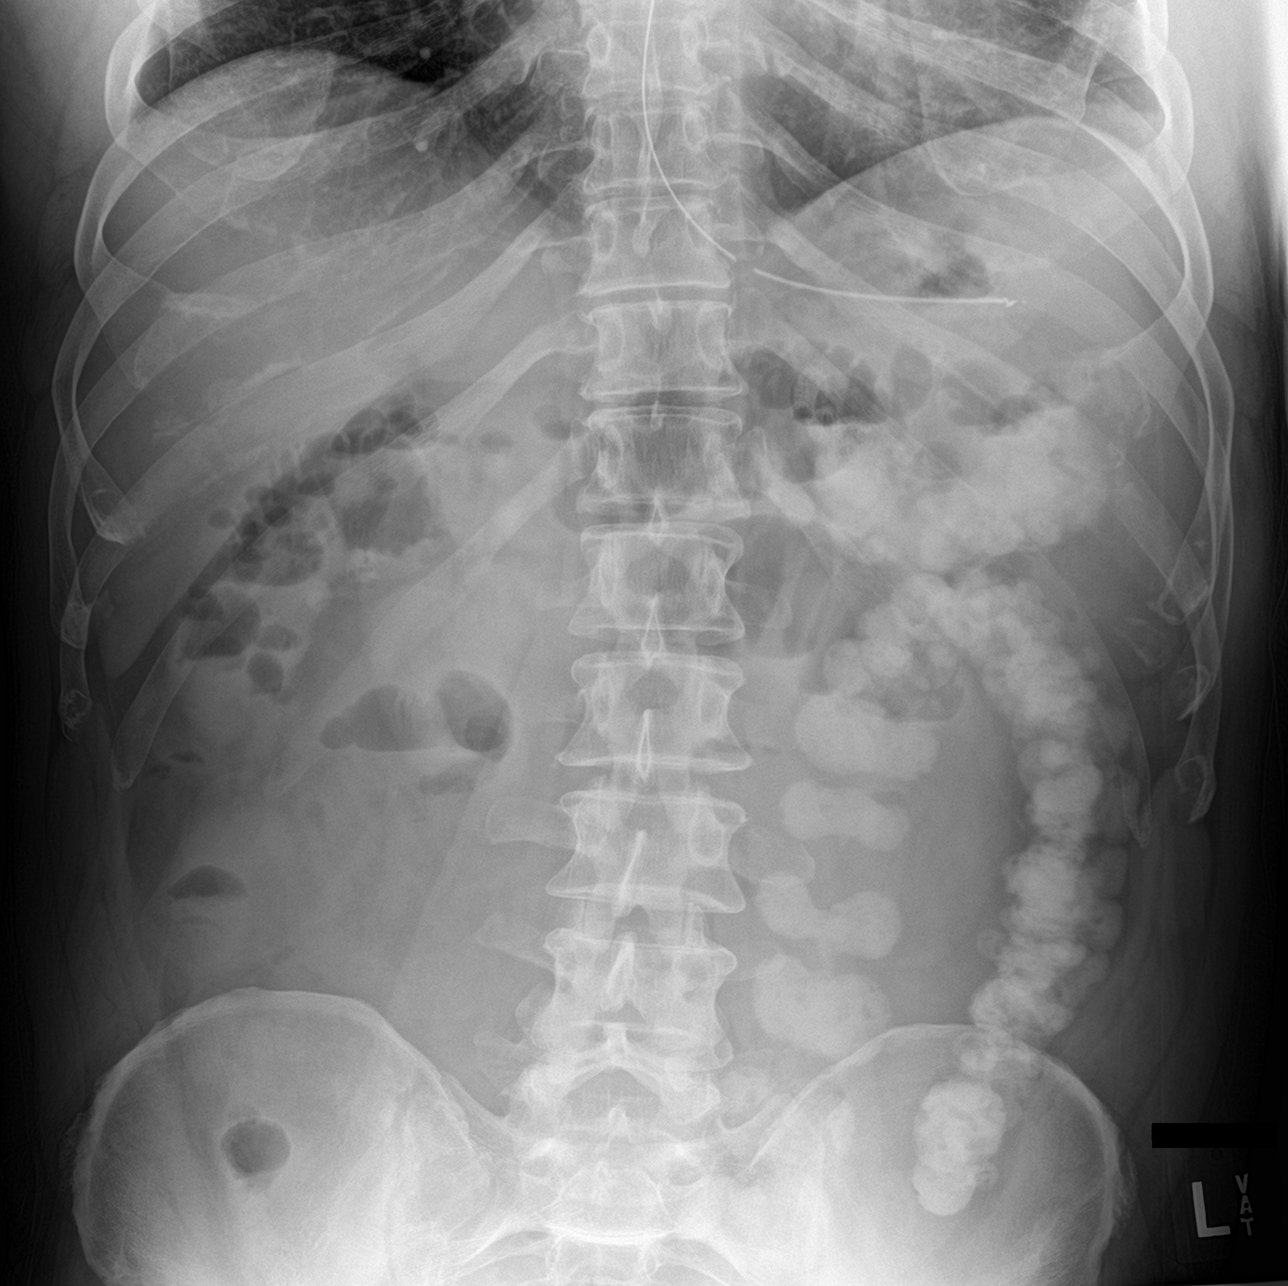

[abdomen supine (1 of 2)]
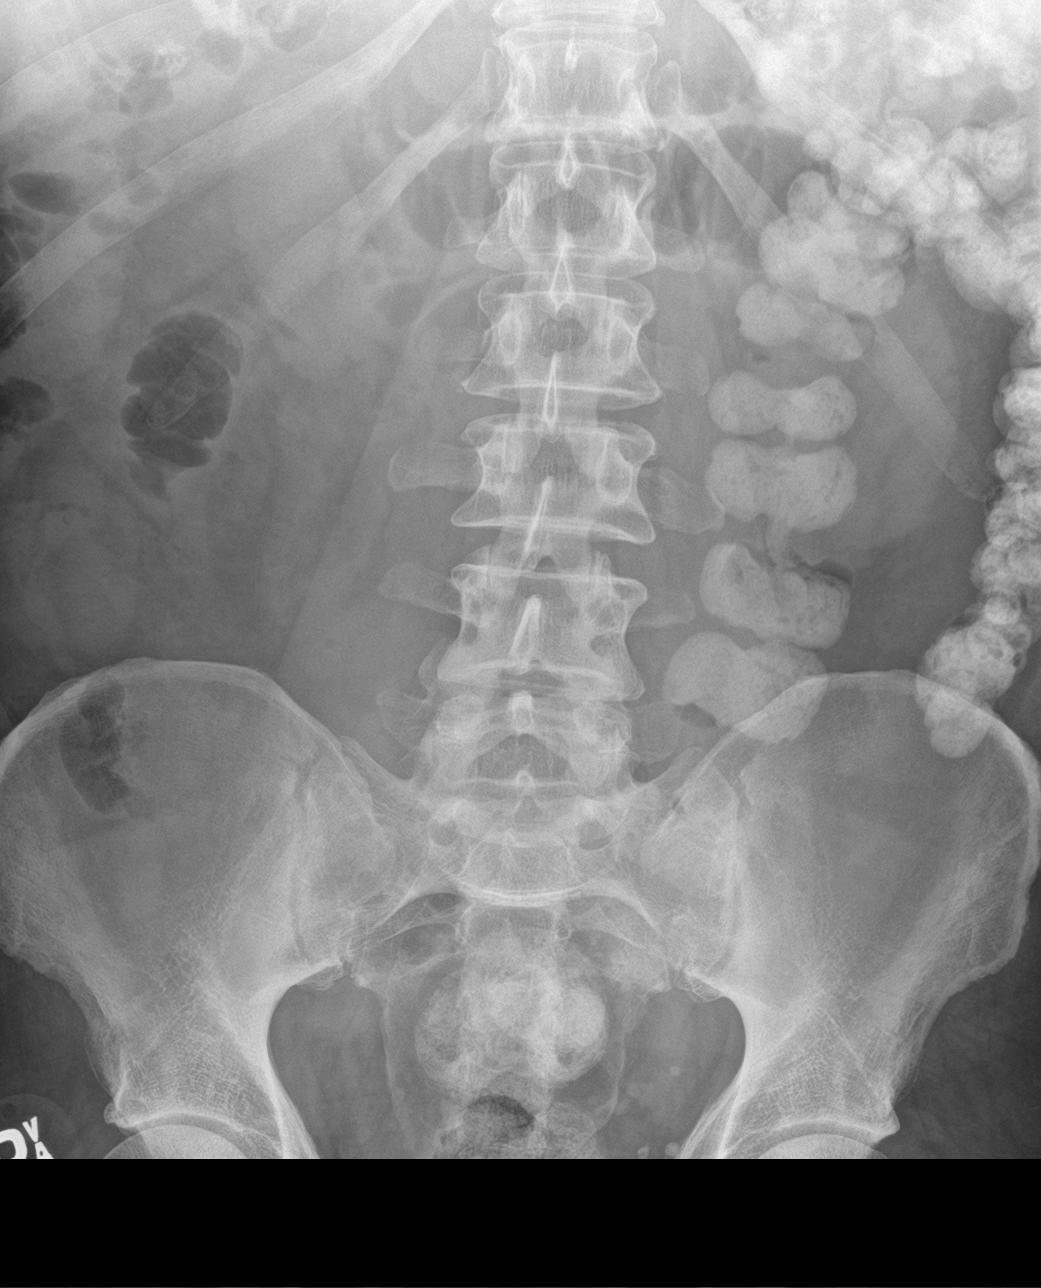

[abdomen supine (2 of 2)]
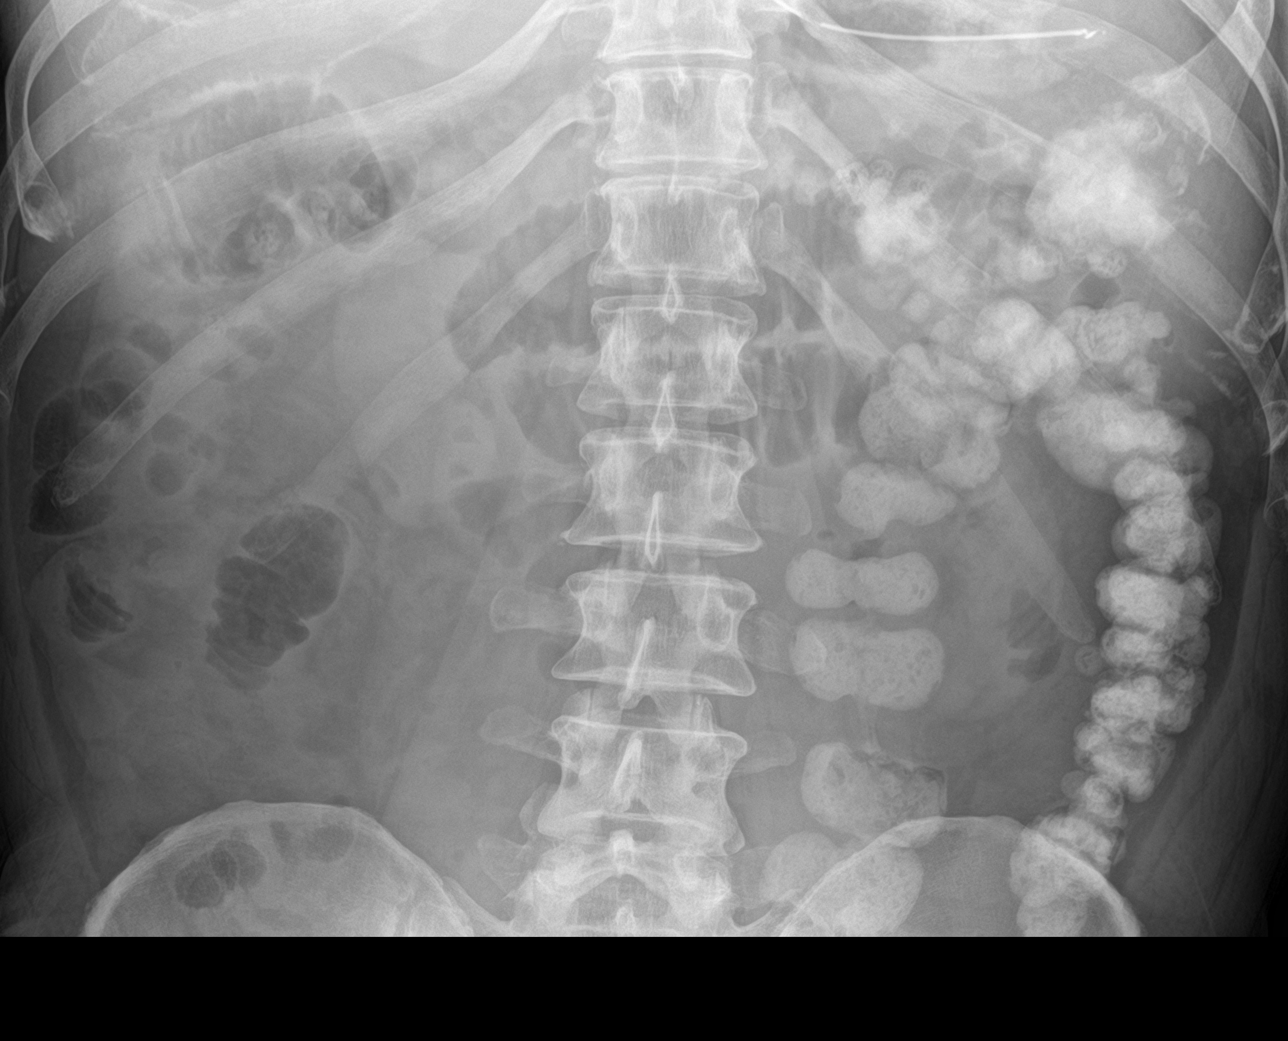

[3 of 3 positions shown; findings below may reference images not displayed]

FINDINGS: Enteric tube in good position. Single gas dilated loop of small
bowel in the central abdomen measuring 3.8 cm diameter. There could
still be fluid-filled and dilated bowel loops in the right lower
quadrant, but the degree of gaseous distention is improved. Oral
contrast is again seen in the left colon. Multiple colonic
diverticula.
IMPRESSION: Continued improvement in small bowel dilatation.

## 2021-08-09 ENCOUNTER — Other Ambulatory Visit: Payer: Self-pay | Admitting: Family

## 2021-08-09 DIAGNOSIS — E039 Hypothyroidism, unspecified: Secondary | ICD-10-CM

## 2021-08-17 ENCOUNTER — Other Ambulatory Visit: Payer: Self-pay

## 2021-08-17 ENCOUNTER — Telehealth: Payer: Self-pay

## 2021-08-17 DIAGNOSIS — E039 Hypothyroidism, unspecified: Secondary | ICD-10-CM

## 2021-08-17 MED ORDER — LEVOTHYROXINE SODIUM 150 MCG PO TABS: ORAL_TABLET | ORAL | 0 refills | Status: DC

## 2021-08-17 NOTE — Telephone Encounter (Signed)
Pt need refill on levothyroxine (SYNTHROID) 150 MCG tablet  [id number] - [place], [place] - [address] AT [address] [facility] [address]   PT scheduled TOC 12/02 needs meds until this appt   With Doyce Loose   Pt call back [phone]

## 2021-08-17 NOTE — Telephone Encounter (Signed)
Medication sent to pharmacy  

## 2021-11-11 ENCOUNTER — Other Ambulatory Visit: Payer: Self-pay | Admitting: Family

## 2021-11-11 ENCOUNTER — Encounter: Payer: 59 | Admitting: Registered Nurse

## 2021-11-11 DIAGNOSIS — E039 Hypothyroidism, unspecified: Secondary | ICD-10-CM

## 2022-01-18 ENCOUNTER — Ambulatory Visit: Payer: 59 | Admitting: Registered Nurse

## 2022-01-18 ENCOUNTER — Encounter: Payer: Self-pay | Admitting: Registered Nurse

## 2022-01-18 ENCOUNTER — Other Ambulatory Visit: Payer: Self-pay

## 2022-01-18 VITALS — BP 122/73 | HR 63 | Temp 98.2°F | Resp 18 | Ht 72.0 in | Wt 218.6 lb

## 2022-01-18 DIAGNOSIS — Z130 Encounter for screening for diseases of the blood and blood-forming organs and certain disorders involving the immune mechanism: Secondary | ICD-10-CM | POA: Diagnosis not present

## 2022-01-18 DIAGNOSIS — E039 Hypothyroidism, unspecified: Secondary | ICD-10-CM

## 2022-01-18 DIAGNOSIS — Z13228 Encounter for screening for other metabolic disorders: Secondary | ICD-10-CM | POA: Diagnosis not present

## 2022-01-18 DIAGNOSIS — R5383 Other fatigue: Secondary | ICD-10-CM

## 2022-01-18 DIAGNOSIS — Z1329 Encounter for screening for other suspected endocrine disorder: Secondary | ICD-10-CM | POA: Diagnosis not present

## 2022-01-18 DIAGNOSIS — Z13220 Encounter for screening for lipoid disorders: Secondary | ICD-10-CM | POA: Diagnosis not present

## 2022-01-18 LAB — CBC WITH DIFFERENTIAL/PLATELET
Basophils Absolute: 0 10*3/uL (ref 0.0–0.1)
Basophils Relative: 0.5 % (ref 0.0–3.0)
Eosinophils Absolute: 0.2 10*3/uL (ref 0.0–0.7)
Eosinophils Relative: 3 % (ref 0.0–5.0)
HCT: 44.8 % (ref 39.0–52.0)
Hemoglobin: 15.2 g/dL (ref 13.0–17.0)
Lymphocytes Relative: 35.4 % (ref 12.0–46.0)
Lymphs Abs: 2.3 10*3/uL (ref 0.7–4.0)
MCHC: 33.9 g/dL (ref 30.0–36.0)
MCV: 84.5 fl (ref 78.0–100.0)
Monocytes Absolute: 0.4 10*3/uL (ref 0.1–1.0)
Monocytes Relative: 6.6 % (ref 3.0–12.0)
Neutro Abs: 3.5 10*3/uL (ref 1.4–7.7)
Neutrophils Relative %: 54.5 % (ref 43.0–77.0)
Platelets: 178 10*3/uL (ref 150.0–400.0)
RBC: 5.3 Mil/uL (ref 4.22–5.81)
RDW: 12.8 % (ref 11.5–15.5)
WBC: 6.5 10*3/uL (ref 4.0–10.5)

## 2022-01-18 LAB — COMPREHENSIVE METABOLIC PANEL
ALT: 25 U/L (ref 0–53)
AST: 23 U/L (ref 0–37)
Albumin: 4.4 g/dL (ref 3.5–5.2)
Alkaline Phosphatase: 47 U/L (ref 39–117)
BUN: 28 mg/dL — ABNORMAL HIGH (ref 6–23)
CO2: 29 mEq/L (ref 19–32)
Calcium: 9.4 mg/dL (ref 8.4–10.5)
Chloride: 102 mEq/L (ref 96–112)
Creatinine, Ser: 1.48 mg/dL (ref 0.40–1.50)
GFR: 55.41 mL/min — ABNORMAL LOW (ref >60.00)
Glucose, Bld: 74 mg/dL (ref 70–99)
Potassium: 4.5 mEq/L (ref 3.5–5.1)
Sodium: 139 mEq/L (ref 135–145)
Total Bilirubin: 0.8 mg/dL (ref 0.2–1.2)
Total Protein: 7 g/dL (ref 6.0–8.3)

## 2022-01-18 LAB — LIPID PANEL
Cholesterol: 201 mg/dL — ABNORMAL HIGH (ref 0–200)
HDL: 68.3 mg/dL (ref >39.00)
LDL Cholesterol: 104 mg/dL — ABNORMAL HIGH (ref 0–99)
NonHDL: 132.62
Total CHOL/HDL Ratio: 3
Triglycerides: 143 mg/dL (ref 0.0–149.0)
VLDL: 28.6 mg/dL (ref 0.0–40.0)

## 2022-01-18 LAB — TESTOSTERONE: Testosterone: 406.4 ng/dL (ref 300.00–890.00)

## 2022-01-18 LAB — HEMOGLOBIN A1C: Hgb A1c MFr Bld: 5.3 % (ref 4.6–6.5)

## 2022-01-18 LAB — TSH: TSH: 9.87 u[IU]/mL — ABNORMAL HIGH (ref 0.35–5.50)

## 2022-01-18 NOTE — Progress Notes (Signed)
Established Patient Office Visit  Subjective:  Patient ID: Bobby Stewart, male    DOB: [birth year]  Age: 51 y.o. MRN: [id number]  CC:  Chief Complaint  Patient presents with   Transitions Of Care    Patient states he is here for TOC.     HPI Bobby Stewart presents for visit to est care.  Notes low energy Hx of anxiety, depression, hypothyroidism Would like to check labs, including testosterone, and explore options.   Past Medical History:  Diagnosis Date   Anxiety and depression    Eagle's syndrome    History of chickenpox    Hyperthyroidism    Plantar fasciitis     Past Surgical History:  Procedure Laterality Date   NO PAST SURGERIES      Family History  Problem Relation Age of Onset   Arthritis Mother        neck   Healthy Father    Thyroid disease Sister    Lung cancer Maternal Grandfather        Smoker   AAA (abdominal aortic aneurysm) Paternal Grandfather     Social History   Socioeconomic History   Marital status: Married    Spouse name: Not on file   Number of children: 1   Years of education: 2.5 years of college   Highest education level: Not on file  Occupational History   Occupation: Social worker  Tobacco Use   Smoking status: Never   Smokeless tobacco: Never  Vaping Use   Vaping Use: Never used  Substance and Sexual Activity   Alcohol use: Not Currently    Comment: occasional   Drug use: No   Sexual activity: Yes  Other Topics Concern   Not on file  Social History Narrative   Lives at home with his family.   Two cups caffeine per day.   Right-handed.   Social Determinants of Health   Financial Resource Strain: Not on file  Food Insecurity: Not on file  Transportation Needs: Not on file  Physical Activity: Not on file  Stress: Not on file  Social Connections: Not on file  Intimate Partner Violence: Not on file    Outpatient Medications Prior to Visit  Medication Sig Dispense Refill   levothyroxine  (SYNTHROID) 150 MCG tablet TAKE 1 TABLET BY MOUTH EVERY MORNING BEFORE BREAKFAST **NOV** (Patient not taking: Reported on 01/18/2022) 30 tablet 0   No facility-administered medications prior to visit.    No Known Allergies  ROS Review of Systems  Constitutional:  Positive for fatigue. Negative for activity change, appetite change, chills, diaphoresis, fever and unexpected weight change.  HENT: Negative.    Eyes: Negative.   Respiratory: Negative.    Cardiovascular: Negative.   Gastrointestinal: Negative.   Genitourinary: Negative.   Musculoskeletal: Negative.   Skin: Negative.   Neurological: Negative.   Psychiatric/Behavioral: Negative.    All other systems reviewed and are negative.    Objective:    Physical Exam Constitutional:      General: He is not in acute distress.    Appearance: Normal appearance. He is normal weight. He is not ill-appearing, toxic-appearing or diaphoretic.  Cardiovascular:     Rate and Rhythm: Normal rate and regular rhythm.     Heart sounds: Normal heart sounds. No murmur heard.   No friction rub. No gallop.  Pulmonary:     Effort: Pulmonary effort is normal. No respiratory distress.     Breath sounds: Normal breath sounds. No stridor. No wheezing, rhonchi or  rales.  Chest:     Chest wall: No tenderness.  Neurological:     General: No focal deficit present.     Mental Status: He is alert and oriented to person, place, and time. Mental status is at baseline.  Psychiatric:        Mood and Affect: Mood normal.        Behavior: Behavior normal.        Thought Content: Thought content normal.        Judgment: Judgment normal.    BP 122/73    Pulse 63    Temp 98.2 F (36.8 C) (Temporal)    Resp 18    Ht 6' (1.829 m)    Wt 218 lb 9.6 oz (99.2 kg)    SpO2 98%    BMI 29.65 kg/m  Wt Readings from Last 3 Encounters:  01/18/22 218 lb 9.6 oz (99.2 kg)  01/18/21 216 lb 6 oz (98.1 kg)  12/08/20 209 lb (94.8 kg)     Health Maintenance Due  Topic  Date Due   Hepatitis C Screening  Never done   COLONOSCOPY (Pts 45-107yrs Insurance coverage will need to be confirmed)  Never done   COVID-19 Vaccine (4 - Booster) 11/15/2021    There are no preventive care reminders to display for this patient.  Lab Results  Component Value Date   TSH 9.87 (H) 01/18/2022   Lab Results  Component Value Date   WBC 6.5 01/18/2022   HGB 15.2 01/18/2022   HCT 44.8 01/18/2022   MCV 84.5 01/18/2022   PLT 178.0 01/18/2022   Lab Results  Component Value Date   NA 139 01/18/2022   K 4.5 01/18/2022   CO2 29 01/18/2022   GLUCOSE 74 01/18/2022   BUN 28 (H) 01/18/2022   CREATININE 1.48 01/18/2022   BILITOT 0.8 01/18/2022   ALKPHOS 47 01/18/2022   AST 23 01/18/2022   ALT 25 01/18/2022   PROT 7.0 01/18/2022   ALBUMIN 4.4 01/18/2022   CALCIUM 9.4 01/18/2022   ANIONGAP 14 12/03/2020   GFR 55.41 (L) 01/18/2022   Lab Results  Component Value Date   CHOL 201 (H) 01/18/2022   Lab Results  Component Value Date   HDL 68.30 01/18/2022   Lab Results  Component Value Date   LDLCALC 104 (H) 01/18/2022   Lab Results  Component Value Date   TRIG 143.0 01/18/2022   Lab Results  Component Value Date   CHOLHDL 3 01/18/2022   Lab Results  Component Value Date   HGBA1C 5.3 01/18/2022      Assessment & Plan:   Problem List Items Addressed This Visit       Endocrine   Hypothyroidism   Relevant Medications   levothyroxine (SYNTHROID) 100 MCG tablet   Other Relevant Orders   TSH (Completed)   Other Visit Diagnoses     Low energy    -  Primary   Relevant Orders   Testosterone (Completed)   Screening for endocrine, metabolic and immunity disorder       Relevant Orders   Comprehensive metabolic panel (Completed)   Hemoglobin A1c (Completed)   CBC with Differential/Platelet (Completed)   Lipid screening       Relevant Orders   Lipid panel (Completed)       Meds ordered this encounter  Medications   levothyroxine (SYNTHROID) 100  MCG tablet    Sig: Take 1 tablet (100 mcg total) by mouth daily.    Dispense:  90 tablet  Refill:  0    Order Specific Question:   Supervising Provider    Answer:   Neva Seat, JEFFREY R [[id number]]    Follow-up: Return in about 6 months (around 07/18/2022) for CPE and labs.   PLAN Suspect hypothyroidism contributing. Restart at po qd. Labs collected. Will follow up with the patient as warranted. Return in 3 mo to recheck thyroid Patient encouraged to call clinic with any questions, comments, or concerns.  Janeece Agee, NP

## 2022-01-18 NOTE — Patient Instructions (Addendum)
[name] -   Doristine Devoid to meet you  Follow up in 6 mo for physical and labs, sooner with concerns.  Will check on labs today to ensure thyroid still doing well  Thank you  Rich    If you have lab work done today you will be contacted with your lab results within the next 2 weeks.  If you have not heard from [place] then please contact us. The fastest way to get your results is to register for My Chart.   IF you received an x-ray today, you will receive an invoice from [facility]. Please contact [facility] at [phone] with questions or concerns regarding your invoice.   IF you received labwork today, you will receive an invoice from [facility]. Please contact [facility] at [phone] with questions or concerns regarding your invoice.   Our billing staff will not be able to assist you with questions regarding bills from these companies.  You will be contacted with the lab results as soon as they are available. The fastest way to get your results is to activate your My Chart account. Instructions are located on the last page of this paperwork. If you have not heard from [place] regarding the results in 2 weeks, please contact this office.

## 2022-01-28 MED ORDER — LEVOTHYROXINE SODIUM 100 MCG PO TABS: 100.0000 ug | ORAL_TABLET | Freq: Every day | ORAL | 0 refills | Status: DC

## 2022-04-18 ENCOUNTER — Ambulatory Visit
Admission: RE | Admit: 2022-04-18 | Discharge: 2022-04-18 | Disposition: A | Discharge status: Home / Self Care | Payer: 59 | Source: Ambulatory Visit | Attending: Sports Medicine | Admitting: Sports Medicine

## 2022-04-18 ENCOUNTER — Ambulatory Visit (INDEPENDENT_AMBULATORY_CARE_PROVIDER_SITE_OTHER): Payer: 59 | Admitting: Sports Medicine

## 2022-04-18 VITALS — BP 130/82 | Ht 72.0 in | Wt 212.0 lb

## 2022-04-18 DIAGNOSIS — M25511 Pain in right shoulder: Secondary | ICD-10-CM

## 2022-04-18 MED ORDER — METHYLPREDNISOLONE ACETATE 40 MG/ML IJ SUSP
40.0000 mg | Freq: Once | INTRAMUSCULAR | Status: AC
Start: 2022-04-18 — End: 2022-04-18
  Administered 2022-04-18: 40 mg via INTRA_ARTICULAR

## 2022-04-18 NOTE — Progress Notes (Signed)
PCP: Janeece Agee, NP ? ?Subjective:  ? ?HPI: ?Patient is a 51 y.o. male here with two months of right shoulder pain Around mid-March, patient was on crutches status post plantar fasciitis surgery (03/03) when he attempted to lower himself onto the floor by supporting his body with his right hand on a small stool. As he descended, he heard a loud "crunch" and immediately felt pain at the shoulder joint. He also experienced limited ROM and arm weakness. Although his ROM has improved (with home stretch exercises using a yellow band), he's continued to have "inconsistent" weakness, intense pain with lifting and especially at night during sleep. Patient notes that he previously had a right shoulder use injury in 2013 that healed after following a personal rehab program at home. Patient is a physically active individual with a 49 year-old son and most recently became dad to 37 month-old twins.   ? ? ? ?Past Medical History:  ?Diagnosis Date  ? Anxiety and depression   ? Eagle's syndrome   ? History of chickenpox   ? Hyperthyroidism   ? Plantar fasciitis   ? ? ?Current Outpatient Medications on File Prior to Visit  ?Medication Sig Dispense Refill  ? levothyroxine (SYNTHROID) 100 MCG tablet Take 1 tablet (100 mcg total) by mouth daily. 90 tablet 0  ? ?No current facility-administered medications on file prior to visit.  ? ? ?Past Surgical History:  ?Procedure Laterality Date  ? NO PAST SURGERIES    ? ? ?No Known Allergies ? ?BP 130/82   Ht 6' (1.829 m)   Wt 212 lb (96.2 kg)   BMI 28.75 kg/m?  ? ?   ? View : No data to display.  ?  ?  ?  ? ? ?   ? View : No data to display.  ?  ?  ?  ? ? ?    ?Objective:  ?Physical Exam: ? ?Gen: NAD, comfortable in exam room. ?Resp: No increased WOB.  ?Psych: Appropriate mood and affect.  ? ?MSK: Right shoulder --  ?No swelling, skin changes, or deformities. No TTP along anterior/posterior bony structures or muscles. Full ROM with pain at the extremes of forward flexion and abduction.   4/5 strength with resisted supraspinatus.  5/5 strength with resisted external and internal rotation.  O'Brien's markedly positive.  ? ?Neurovascular intact distally.  ? ?X-rays of the right shoulder including AP, lateral, and axillary views show nothing acute.  No significant degenerative changes. ? ? ?  ?Assessment & Plan:  ?1. Right shoulder pain secondary to rotator cuff tear versus labral tear ?Patient is a 51 year-old male presenting with two months of shoulder pain following a slow descend with hand supported on stool. Patient's ROM improved (with home stretch exercises) but he has continued to have significant pain deep in the shoulder with inconsistent strength when lifting. Physical exam suggest he may have a potential SLAP tear (O'Brien's markedly positive). It is possible that the patient hyperextended his arm when he was lowering himself onto the floor, causing a labrum injury. We discussed the importance of having an MRI arthrogram in order to visualize the labrum plus other soft tissues, and determine potential etiology for this pain. Patient agrees and inquired about pain control options in the interim, requesting an injection today if possible. We reviewed risks/benefits of cortisol shot, and he provided consent for ultrasound guided injection.  ? ?In summary, we reviewed the following treatment plan:  ?MRI arthrogram of right shoulder ?Cortisol shot today for  pain control ?Continue home exercises focusing on ROM. Activity as tolerated  ?Follow up appointment to review results and next-steps  ? ?Leafy Ro, MS4  ?[facility]  ? ? Consent obtained and verified. ?Time-out conducted. ?Noted no overlying erythema, induration, or other signs of local infection. ?Skin prepped in a sterile fashion. ?Topical analgesic spray: Ethyl chloride. ?Joint: Right shoulder ?Needle: 25-gauge 1.5 inch ?Completed without difficulty. ?Meds: 3 cc 1% Xylocaine, 1 cc (40 mg) Depo-Medrol ? ?Advised to  call if fevers/chills, erythema, induration, drainage, or persistent bleeding.  ? ?Patient seen and evaluated with the medical student.  I agree with the above plan of care.  Patient's history and physical exam findings are consistent with possible rotator cuff tear or labral tear.  MRI arthrogram to evaluate further.  Patient was asking about a possible cortisone injection to help alleviate some of his discomfort.  Subacromial cortisone injection administered as above.  Phone follow-up with MRI arthrogram results when available and we will delineate a more definitive treatment plan based on those findings. ? ?This note was dictated using Dragon naturally speaking software and may contain errors in syntax, spelling, or content which have not been identified prior to signing this note.  ?

## 2022-04-19 NOTE — Addendum Note (Signed)
Addended by: Reino Bellis R on: 04/19/2022 10:06 AM ? ? Modules accepted: Level of Service ? ?

## 2022-04-27 ENCOUNTER — Other Ambulatory Visit: Payer: Self-pay | Admitting: Registered Nurse

## 2022-04-27 DIAGNOSIS — E039 Hypothyroidism, unspecified: Secondary | ICD-10-CM

## 2022-05-02 ENCOUNTER — Ambulatory Visit
Admission: RE | Admit: 2022-05-02 | Discharge: 2022-05-02 | Disposition: A | Discharge status: Home / Self Care | Payer: 59 | Source: Ambulatory Visit | Attending: Sports Medicine | Admitting: Sports Medicine

## 2022-05-02 DIAGNOSIS — M25511 Pain in right shoulder: Secondary | ICD-10-CM

## 2022-05-02 MED ORDER — IOPAMIDOL (ISOVUE-M 200) INJECTION 41%
12.0000 mL | Freq: Once | INTRAMUSCULAR | Status: AC
  Administered 2022-05-02: 12 mL via INTRA_ARTICULAR

## 2022-05-04 ENCOUNTER — Telehealth: Payer: Self-pay | Admitting: Sports Medicine

## 2022-05-04 NOTE — Telephone Encounter (Signed)
  I spoke with Bobby Stewart on the phone today after reviewing MRI arthrogram findings of his right shoulder.  He has a partial-thickness tear of the supraspinatus tendon in the critical zone.  There is AP retraction of 1.3 cm.  There is likely some full-thickness component to this as well.  Labrum appears to be intact.  I recommended consultation with Dr. Ave Filter to discuss best course of treatment going forward.  I will defer further treatment to the discretion of Dr. Ave Filter and the patient will follow-up with me as needed.

## 2022-07-18 ENCOUNTER — Encounter: Payer: 59 | Admitting: Registered Nurse

## 2022-07-26 ENCOUNTER — Ambulatory Visit (INDEPENDENT_AMBULATORY_CARE_PROVIDER_SITE_OTHER): Payer: 59 | Admitting: Family Medicine

## 2022-07-26 ENCOUNTER — Encounter: Payer: Self-pay | Admitting: Family Medicine

## 2022-07-26 VITALS — BP 122/72 | HR 96 | Temp 99.5°F | Resp 20 | Ht 73.0 in | Wt 212.6 lb

## 2022-07-26 DIAGNOSIS — Z0000 Encounter for general adult medical examination without abnormal findings: Secondary | ICD-10-CM

## 2022-07-26 DIAGNOSIS — E663 Overweight: Secondary | ICD-10-CM | POA: Diagnosis not present

## 2022-07-26 DIAGNOSIS — Z125 Encounter for screening for malignant neoplasm of prostate: Secondary | ICD-10-CM

## 2022-07-26 LAB — HEPATIC FUNCTION PANEL
ALT: 20 U/L (ref 0–53)
AST: 18 U/L (ref 0–37)
Albumin: 4.5 g/dL (ref 3.5–5.2)
Alkaline Phosphatase: 55 U/L (ref 39–117)
Bilirubin, Direct: 0.2 mg/dL (ref 0.0–0.3)
Total Bilirubin: 0.7 mg/dL (ref 0.2–1.2)
Total Protein: 6.8 g/dL (ref 6.0–8.3)

## 2022-07-26 LAB — LIPID PANEL
Cholesterol: 187 mg/dL (ref 0–200)
HDL: 63 mg/dL (ref >39.00)
NonHDL: 124.18
Total CHOL/HDL Ratio: 3
Triglycerides: 211 mg/dL — ABNORMAL HIGH (ref 0.0–149.0)
VLDL: 42.2 mg/dL — ABNORMAL HIGH (ref 0.0–40.0)

## 2022-07-26 LAB — CBC WITH DIFFERENTIAL/PLATELET
Basophils Absolute: 0 10*3/uL (ref 0.0–0.1)
Basophils Relative: 0.5 % (ref 0.0–3.0)
Eosinophils Absolute: 0.2 10*3/uL (ref 0.0–0.7)
Eosinophils Relative: 2.6 % (ref 0.0–5.0)
HCT: 43.6 % (ref 39.0–52.0)
Hemoglobin: 15 g/dL (ref 13.0–17.0)
Lymphocytes Relative: 41.2 % (ref 12.0–46.0)
Lymphs Abs: 2.7 10*3/uL (ref 0.7–4.0)
MCHC: 34.3 g/dL (ref 30.0–36.0)
MCV: 85.4 fl (ref 78.0–100.0)
Monocytes Absolute: 0.4 10*3/uL (ref 0.1–1.0)
Monocytes Relative: 5.7 % (ref 3.0–12.0)
Neutro Abs: 3.3 10*3/uL (ref 1.4–7.7)
Neutrophils Relative %: 50 % (ref 43.0–77.0)
Platelets: 177 10*3/uL (ref 150.0–400.0)
RBC: 5.1 Mil/uL (ref 4.22–5.81)
RDW: 12.5 % (ref 11.5–15.5)
WBC: 6.5 10*3/uL (ref 4.0–10.5)

## 2022-07-26 LAB — BASIC METABOLIC PANEL
BUN: 22 mg/dL (ref 6–23)
CO2: 28 mEq/L (ref 19–32)
Calcium: 9.1 mg/dL (ref 8.4–10.5)
Chloride: 101 mEq/L (ref 96–112)
Creatinine, Ser: 1.2 mg/dL (ref 0.40–1.50)
GFR: 71.01 mL/min (ref >60.00)
Glucose, Bld: 79 mg/dL (ref 70–99)
Potassium: 3.6 mEq/L (ref 3.5–5.1)
Sodium: 138 mEq/L (ref 135–145)

## 2022-07-26 LAB — TSH: TSH: 5.04 u[IU]/mL (ref 0.35–5.50)

## 2022-07-26 LAB — LDL CHOLESTEROL, DIRECT: Direct LDL: 94 mg/dL

## 2022-07-26 LAB — PSA: PSA: 0.46 ng/mL (ref 0.10–4.00)

## 2022-07-26 NOTE — Patient Instructions (Signed)
Establish with your new provider so there is no lapse in your care We'll notify you of your lab results and make any changes if needed Keep up the good work on healthy diet and regular exercise- you're doing great! Call and schedule your colonoscopy at your convenience Call with any questions or concerns Enjoy the rest of your summer!!!

## 2022-07-26 NOTE — Progress Notes (Signed)
   Subjective:    Patient ID: Bobby Stewart, male    DOB: 06-27-1971, 51 y.o.   MRN: [id number]  HPI CPE- UTD on Tdap.  Due for colonoscopy- has provider in mind, will call to schedule  Patient Care Team    Relationship Specialty Notifications Start End  Janeece Agee, NP (Inactive) PCP - General Adult Health Nurse Practitioner  01/28/22   Lovett Sox, MD Attending Physician Cardiothoracic Surgery  10/15/12     Health Maintenance  Topic Date Due   Hepatitis C Screening  Never done   INFLUENZA VACCINE  07/11/2022   COVID-19 Vaccine (4 - Pfizer series) 08/11/2022 (Originally 11/15/2021)   COLONOSCOPY (Pts 45-60yrs Insurance coverage will need to be confirmed)  07/27/2023 (Originally 01/23/2018)   TETANUS/TDAP  06/08/2028   HIV Screening  Completed   HPV VACCINES  Aged Out      Review of Systems Patient reports no vision/hearing changes, anorexia, fever ,adenopathy, persistant/recurrent hoarseness, swallowing issues, chest pain, palpitations, edema, persistant/recurrent cough, hemoptysis, dyspnea (rest,exertional, paroxysmal nocturnal), gastrointestinal  bleeding (melena, rectal bleeding), abdominal pain, excessive heart burn, GU symptoms (dysuria, hematuria, voiding/incontinence issues) syncope, focal weakness, memory loss, numbness & tingling, skin/hair/nail changes, depression, anxiety, abnormal bruising/bleeding, musculoskeletal symptoms/signs.   + fatigue    Objective:   Physical Exam General Appearance:    Alert, cooperative, no distress, appears stated age  Head:    Normocephalic, without obvious abnormality, atraumatic  Eyes:    PERRL, conjunctiva/corneas clear, EOM's intact both eyes       Ears:    Normal TM's and external ear canals, both ears  Nose:   Nares normal, septum midline, mucosa normal, no drainage   or sinus tenderness  Throat:   Lips, mucosa, and tongue normal; teeth and gums normal  Neck:   Supple, symmetrical, trachea midline, no adenopathy;        thyroid:  No enlargement/tenderness/nodules  Back:     Symmetric, no curvature, ROM normal, no CVA tenderness  Lungs:     Clear to auscultation bilaterally, respirations unlabored  Chest wall:    No tenderness or deformity  Heart:    Regular rate and rhythm, S1 and S2 normal, no murmur, rub   or gallop  Abdomen:     Soft, non-tender, bowel sounds active all four quadrants,    no masses, no organomegaly  Genitalia:    deferred  Rectal:    Extremities:   Extremities normal, atraumatic, no cyanosis or edema  Pulses:   2+ and symmetric all extremities  Skin:   Skin color, texture, turgor normal, no rashes or lesions  Lymph nodes:   Cervical, supraclavicular, and axillary nodes normal  Neurologic:   CNII-XII intact. Normal strength, sensation and reflexes      throughout          Assessment & Plan:

## 2022-07-26 NOTE — Assessment & Plan Note (Signed)
Pt's PE WNL.  UTD on Tdap.  Due for colonoscopy.  Pt to schedule.  Check labs.  Anticipatory guidance provided.

## 2022-07-26 NOTE — Assessment & Plan Note (Signed)
Most likely due to BMI not factoring muscle mass into account.  Encouraged healthy diet, regular exercise.  Will check labs to risk stratify.  Will follow.

## 2022-08-31 ENCOUNTER — Other Ambulatory Visit: Payer: Self-pay

## 2022-08-31 DIAGNOSIS — E039 Hypothyroidism, unspecified: Secondary | ICD-10-CM

## 2022-08-31 MED ORDER — LEVOTHYROXINE SODIUM 100 MCG PO TABS: ORAL_TABLET | ORAL | 0 refills | Status: DC

## 2022-12-07 ENCOUNTER — Other Ambulatory Visit: Payer: Self-pay | Admitting: Family Medicine

## 2022-12-07 DIAGNOSIS — E039 Hypothyroidism, unspecified: Secondary | ICD-10-CM

## 2023-03-07 ENCOUNTER — Other Ambulatory Visit: Payer: Self-pay | Admitting: Family Medicine

## 2023-03-07 DIAGNOSIS — E039 Hypothyroidism, unspecified: Secondary | ICD-10-CM

## 2023-03-07 MED ORDER — LEVOTHYROXINE SODIUM 100 MCG PO TABS: ORAL_TABLET | ORAL | 0 refills | Status: DC

## 2023-11-01 ENCOUNTER — Other Ambulatory Visit: Payer: Self-pay | Admitting: Family Medicine

## 2023-11-01 DIAGNOSIS — E039 Hypothyroidism, unspecified: Secondary | ICD-10-CM

## 2023-11-01 NOTE — Telephone Encounter (Signed)
Pt was last seen 07/26/2022 for annual exam Per your last notes pt was to [facility] w/provider so no lapse in his care. Called pt to ask if he has a new provider he stated Dr.Tabori.  Please advise if this is your pt?  Would you like to refill this?

## 2023-11-01 NOTE — Telephone Encounter (Signed)
Per Dr.Tabori. Pt will come in office for Est Care appt and refill will be sent in at this time

## 2023-11-02 ENCOUNTER — Ambulatory Visit: Payer: 59 | Admitting: Family Medicine

## 2023-11-02 ENCOUNTER — Encounter: Payer: Self-pay | Admitting: Family Medicine

## 2023-11-02 VITALS — BP 120/72 | HR 64 | Temp 98.1°F | Ht 73.0 in | Wt 210.0 lb

## 2023-11-02 DIAGNOSIS — E039 Hypothyroidism, unspecified: Secondary | ICD-10-CM | POA: Diagnosis not present

## 2023-11-02 DIAGNOSIS — Z1211 Encounter for screening for malignant neoplasm of colon: Secondary | ICD-10-CM

## 2023-11-02 LAB — HEPATIC FUNCTION PANEL
ALT: 19 U/L (ref 0–53)
AST: 20 U/L (ref 0–37)
Albumin: 4.4 g/dL (ref 3.5–5.2)
Alkaline Phosphatase: 52 U/L (ref 39–117)
Bilirubin, Direct: 0.1 mg/dL (ref 0.0–0.3)
Total Bilirubin: 0.7 mg/dL (ref 0.2–1.2)
Total Protein: 6.5 g/dL (ref 6.0–8.3)

## 2023-11-02 LAB — BASIC METABOLIC PANEL
BUN: 24 mg/dL — ABNORMAL HIGH (ref 6–23)
CO2: 32 meq/L (ref 19–32)
Calcium: 9.1 mg/dL (ref 8.4–10.5)
Chloride: 101 meq/L (ref 96–112)
Creatinine, Ser: 1.24 mg/dL (ref 0.40–1.50)
GFR: 67.67 mL/min (ref >60.00)
Glucose, Bld: 73 mg/dL (ref 70–99)
Potassium: 4.9 meq/L (ref 3.5–5.1)
Sodium: 139 meq/L (ref 135–145)

## 2023-11-02 LAB — CBC WITH DIFFERENTIAL/PLATELET
Basophils Absolute: 0 10*3/uL (ref 0.0–0.1)
Basophils Relative: 0.5 % (ref 0.0–3.0)
Eosinophils Absolute: 0.2 10*3/uL (ref 0.0–0.7)
Eosinophils Relative: 2.4 % (ref 0.0–5.0)
HCT: 46.1 % (ref 39.0–52.0)
Hemoglobin: 15.3 g/dL (ref 13.0–17.0)
Lymphocytes Relative: 30.5 % (ref 12.0–46.0)
Lymphs Abs: 2.1 10*3/uL (ref 0.7–4.0)
MCHC: 33.2 g/dL (ref 30.0–36.0)
MCV: 87.7 fL (ref 78.0–100.0)
Monocytes Absolute: 0.5 10*3/uL (ref 0.1–1.0)
Monocytes Relative: 6.5 % (ref 3.0–12.0)
Neutro Abs: 4.2 10*3/uL (ref 1.4–7.7)
Neutrophils Relative %: 60.1 % (ref 43.0–77.0)
Platelets: 187 10*3/uL (ref 150.0–400.0)
RBC: 5.26 Mil/uL (ref 4.22–5.81)
RDW: 12.4 % (ref 11.5–15.5)
WBC: 7 10*3/uL (ref 4.0–10.5)

## 2023-11-02 LAB — LIPID PANEL
Cholesterol: 191 mg/dL (ref 0–200)
HDL: 63.7 mg/dL (ref >39.00)
LDL Cholesterol: 99 mg/dL (ref 0–99)
NonHDL: 127.77
Total CHOL/HDL Ratio: 3
Triglycerides: 144 mg/dL (ref 0.0–149.0)
VLDL: 28.8 mg/dL (ref 0.0–40.0)

## 2023-11-02 LAB — TSH: TSH: 9.29 u[IU]/mL — ABNORMAL HIGH (ref 0.35–5.50)

## 2023-11-02 MED ORDER — LEVOTHYROXINE SODIUM 100 MCG PO TABS: ORAL_TABLET | ORAL | 0 refills | Status: DC

## 2023-11-02 NOTE — Patient Instructions (Signed)
Schedule your complete physical in 6 months with your new provider We'll notify you of your lab results and make any changes if needed We will call you to schedule your GI consultation for the colonoscopy Continue to work on healthy diet and regular exercise- you're doing great! Call with any questions or concerns Happy Holidays!!

## 2023-11-02 NOTE — Progress Notes (Signed)
   Subjective:    Patient ID: Bobby Stewart, male    DOB: 1971/07/19, 52 y.o.   MRN: [id number]  HPI Hypothyroid- pt has been off medication recently.  Thinks he ran out >1 month ago.  + fatigue.  No dizziness.  Doesn't feel 'strong'.  Denies heat or cold intolerance.  No CP, SOB, HA's, visual changes, abd pain, N/V.  Colon cancer screening   Review of Systems For ROS see HPI     Objective:   Physical Exam Vitals reviewed.  Constitutional:      General: He is not in acute distress.    Appearance: Normal appearance. He is well-developed. He is not ill-appearing.  HENT:     Head: Normocephalic and atraumatic.  Eyes:     Extraocular Movements: Extraocular movements intact.     Conjunctiva/sclera: Conjunctivae normal.     Pupils: Pupils are equal, round, and reactive to light.  Neck:     Thyroid: No thyromegaly.  Cardiovascular:     Rate and Rhythm: Normal rate and regular rhythm.     Pulses: Normal pulses.     Heart sounds: Normal heart sounds. No murmur heard. Pulmonary:     Effort: Pulmonary effort is normal. No respiratory distress.     Breath sounds: Normal breath sounds.  Abdominal:     General: Bowel sounds are normal. There is no distension.     Palpations: Abdomen is soft.  Musculoskeletal:     Cervical back: Normal range of motion and neck supple.     Right lower leg: No edema.     Left lower leg: No edema.  Lymphadenopathy:     Cervical: No cervical adenopathy.  Skin:    General: Skin is warm and dry.  Neurological:     General: No focal deficit present.     Mental Status: He is alert and oriented to person, place, and time.     Cranial Nerves: No cranial nerve deficit.  Psychiatric:        Mood and Affect: Mood normal.        Behavior: Behavior normal.          Assessment & Plan:

## 2023-11-02 NOTE — Assessment & Plan Note (Signed)
Chronic problem for pt.  Has been out of medication for at least 1 month (but likely longer).  Will check labs today and determine what dose of Levothyroxine is appropriate.  Pt expressed understanding and is in agreement w/ plan.

## 2023-11-05 ENCOUNTER — Other Ambulatory Visit: Payer: Self-pay

## 2023-11-05 ENCOUNTER — Telehealth: Payer: Self-pay

## 2023-11-05 DIAGNOSIS — E039 Hypothyroidism, unspecified: Secondary | ICD-10-CM

## 2023-11-05 MED ORDER — LEVOTHYROXINE SODIUM 50 MCG PO TABS: 50.0000 ug | ORAL_TABLET | Freq: Every day | ORAL | 3 refills | Status: DC

## 2023-11-05 MED ORDER — LEVOTHYROXINE SODIUM 50 MCG PO TABS: 50.0000 ug | ORAL_TABLET | Freq: Every day | ORAL | 3 refills | Status: AC

## 2023-11-05 NOTE — Telephone Encounter (Signed)
-----   Message from Neena Rhymes sent at 11/05/2023  7:43 AM EST ----- Labs look good w/ exception of TSH (which we expected since you've been out of your medication).  Based on this, we need to restart Levothyroxine at daily (#30, 3 refills) and repeat your TSH level at a lab only visit in 1 month (dx hypothyroid)

## 2023-11-06 NOTE — Telephone Encounter (Signed)
Pt has been scheduled for lab only visit 12/06/23

## 2023-12-06 ENCOUNTER — Encounter: Payer: Self-pay | Admitting: General Practice

## 2023-12-06 ENCOUNTER — Other Ambulatory Visit: Payer: 59

## 2024-12-28 ENCOUNTER — Other Ambulatory Visit: Payer: Self-pay | Admitting: Family Medicine
